# Patient Record
Sex: Female | Born: 1985 | Race: White | Hispanic: No | Marital: Married | Smoking: Former smoker
Health system: Southern US, Community
[De-identification: ages and names within clinical notes are randomized; demographics above are authoritative.]

## PROBLEM LIST (undated history)

## (undated) ENCOUNTER — Inpatient Hospital Stay (HOSPITAL_COMMUNITY): Payer: BC Managed Care – PPO

## (undated) DIAGNOSIS — J45909 Unspecified asthma, uncomplicated: Secondary | ICD-10-CM

## (undated) HISTORY — DX: Unspecified asthma, uncomplicated: J45.909

---

## 2006-03-27 ENCOUNTER — Observation Stay (HOSPITAL_COMMUNITY): Admission: EM | Admit: 2006-03-27 | Discharge: 2006-03-27 | Payer: Self-pay | Admitting: Emergency Medicine

## 2016-10-24 ENCOUNTER — Telehealth: Payer: Self-pay | Admitting: *Deleted

## 2016-10-24 NOTE — Telephone Encounter (Signed)
PreVisit Call attempted. Left VM. 

## 2016-10-25 ENCOUNTER — Encounter: Payer: Self-pay | Admitting: Physician Assistant

## 2016-10-25 ENCOUNTER — Ambulatory Visit (INDEPENDENT_AMBULATORY_CARE_PROVIDER_SITE_OTHER): Payer: Self-pay | Admitting: Physician Assistant

## 2016-10-25 ENCOUNTER — Ambulatory Visit (INDEPENDENT_AMBULATORY_CARE_PROVIDER_SITE_OTHER): Payer: BLUE CROSS/BLUE SHIELD

## 2016-10-25 VITALS — BP 136/90 | HR 90 | Temp 98.3°F | Ht 67.0 in | Wt 252.0 lb

## 2016-10-25 DIAGNOSIS — M545 Low back pain, unspecified: Secondary | ICD-10-CM | POA: Insufficient documentation

## 2016-10-25 DIAGNOSIS — J45909 Unspecified asthma, uncomplicated: Secondary | ICD-10-CM | POA: Insufficient documentation

## 2016-10-25 DIAGNOSIS — G8929 Other chronic pain: Secondary | ICD-10-CM

## 2016-10-25 DIAGNOSIS — Z23 Encounter for immunization: Secondary | ICD-10-CM | POA: Diagnosis not present

## 2016-10-25 DIAGNOSIS — J4540 Moderate persistent asthma, uncomplicated: Secondary | ICD-10-CM | POA: Diagnosis not present

## 2016-10-25 DIAGNOSIS — E6609 Other obesity due to excess calories: Secondary | ICD-10-CM | POA: Insufficient documentation

## 2016-10-25 LAB — POCT URINE PREGNANCY: Preg Test, Ur: NEGATIVE

## 2016-10-25 MED ORDER — CYCLOBENZAPRINE HCL 5 MG PO TABS: 5.0000 mg | ORAL_TABLET | Freq: Three times a day (TID) | ORAL | 0 refills | Status: DC | PRN

## 2016-10-25 MED ORDER — MELOXICAM 15 MG PO TABS: 15.0000 mg | ORAL_TABLET | Freq: Every day | ORAL | 0 refills | Status: DC

## 2016-10-25 MED ORDER — FLUTICASONE-SALMETEROL 115-21 MCG/ACT IN AERO: 2.0000 | INHALATION_SPRAY | Freq: Two times a day (BID) | RESPIRATORY_TRACT | 0 refills | Status: DC

## 2016-10-25 MED ORDER — ALBUTEROL SULFATE HFA 108 (90 BASE) MCG/ACT IN AERS: 1.0000 | INHALATION_SPRAY | RESPIRATORY_TRACT | 1 refills | Status: DC | PRN

## 2016-10-25 NOTE — Progress Notes (Signed)
Lisa Colon is a 31 y.o. female here to Establish Care and discuss low back pain she is having at present again from old injury in January.  I acted as a Neurosurgeonscribe for [employer]Samantha Jeanell Mangan, PA-C Corky Mullonna Orphanos, LPN  History of Present Illness:   Chief Complaint  Patient presents with  . Establish Care  . Back Pain    injured in Jan and now pain is back, started 3 days ago radiates down left thigh    Acute Concerns: Back pain -- in January she was went to reach for one of her cats and believes that she pulled a muscle. She had pain for about a week located at the bottom left of back, did not go to the doctor, ibuprofen was used initially. Patient has mostly been gone, however she worked in the yard over the weekend and now has some return of her back pain with radiation down the side of her L thigh. She has not been taking any medication for this. She has used heating pad 2-3 times. Denies issues with bowel or bladder, no numbness, no issues with UTI-like symptoms. She currently does not exercise however her and her husband recently bought some workout equipment at home and they're hoping to get into a workout regimen soon. She does currently weigh her highest weight ever, she does drink approximately 20 cans of beer per week. She is hoping to cut this out as well as start this exercise regimen. She denies any fevers, chills, rashes. She has a sedentary desk job. Her last period was approximately one month ago.  Chronic Issues: Asthma -- started when she was a kid, Advair twice a day keeps well controlled, when it runs out then has to use proair  Health Maintenance: Immunizations -- up to date Exercise -- no current exercise, but just bought bike and punching bag Weight -- Weight: 252 lb (114.3 kg)  -- highest weight ever  Depression screen PHQ 2/9 10/25/2016  Decreased Interest 0  Down, Depressed, Hopeless 0  PHQ - 2 Score 0   Other providers/specialists: Ob-Gyn -- last year, Dr.  Rito Ehrlich'Keefe   PMHx, SurgHx, SocialHx, Medications, and Allergies were reviewed in the Visit Navigator and updated as appropriate.  Current Medications:   Current Outpatient Prescriptions:  .  albuterol (PROVENTIL HFA;VENTOLIN HFA) 108 (90 Base) MCG/ACT inhaler, Inhale 1-2 puffs into the lungs every 4 (four) hours as needed for wheezing or shortness of breath., Disp: 1 Inhaler, Rfl: 1 .  cyclobenzaprine (FLEXERIL) 5 MG tablet, Take 1 tablet (5 mg total) by mouth 3 (three) times daily as needed for muscle spasms., Disp: 20 tablet, Rfl: 0 .  fluticasone-salmeterol (ADVAIR HFA) 115-21 MCG/ACT inhaler, Inhale 2 puffs into the lungs 2 (two) times daily., Disp: 3 Inhaler, Rfl: 0 .  meloxicam (MOBIC) 15 MG tablet, Take 1 tablet (15 mg total) by mouth daily., Disp: 30 tablet, Rfl: 0   Review of Systems:   Review of Systems  Constitutional: Positive for chills, fever and malaise/fatigue.  Genitourinary: Negative for dysuria, flank pain, frequency and urgency.  Musculoskeletal: Positive for back pain.       Low back pain, radiating down left thigh  Psychiatric/Behavioral: The patient is nervous/anxious.     Vitals:   Vitals:   10/25/16 0937  BP: 136/90  Pulse: 90  Temp: 98.3 F (36.8 C)  TempSrc: Oral  SpO2: 96%  Weight: 252 lb (114.3 kg)  Height: 5\' 7"  (1.702 m)     Body mass index is 39.47  kg/m.  Physical Exam:   Physical Exam  Constitutional: She appears well-developed. She is cooperative.  Non-toxic appearance. She does not have a sickly appearance. She does not appear ill. No distress.  Cardiovascular: Normal rate, regular rhythm, S1 normal, S2 normal, normal heart sounds and normal pulses.   No LE edema  Pulmonary/Chest: Effort normal and breath sounds normal.  Musculoskeletal:       Lumbar back: She exhibits tenderness and spasm. She exhibits no bony tenderness, no swelling, no deformity and no laceration.  No bony tenderness however does have some paraspinal tenderness in the  lumbar region. No decreased range of motion limited to pain when doing lateral side bends but does have with rotation. Positive straight leg raise with left leg and negative on the right.  Neurological: She is alert. No cranial nerve deficit or sensory deficit. GCS eye subscore is 4. GCS verbal subscore is 5. GCS motor subscore is 6.  Skin: Skin is warm.  Nursing note and vitals reviewed.   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 CT scanning of the lower lumbar spine would be useful to confer this finding.  Mild disc space narrowing at L1-2 with anterior inferior endplate spurring at L1.    Assessment and Plan:    Problem List Items Addressed This Visit      Respiratory   Asthma    Currently well controlled with Advair twice a day. Does use pro-air for breakthrough issues. I have refilled both of these medications. Follow-up with any changes present.      Relevant Medications   fluticasone-salmeterol (ADVAIR HFA) 115-21 MCG/ACT inhaler   albuterol (PROVENTIL HFA;VENTOLIN HFA) 108 (90 Base) MCG/ACT inhaler     Other   Chronic left-sided low back pain - Primary    Urine pregnancy test negative. I did get an x-ray to assess her back, as this has not been performed and she has had back pain for several months. We will try some meloxicam today. I gave her these small supply of Flexeril for breakthrough muscle spasms. I advised her that weight loss would be very beneficial for her back pain. I would like for her to follow-up with me in 2-3 months to discuss this. If her back pain does not improve in about 2 weeks, I advised that she should follow-up with Dr. Gaspar Bidding in sports medicine here at [facility].  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 CT scanning of the lower lumbar spine would be useful to confer this finding. Mild disc space narrowing at L1-2 with anterior inferior endplate spurring at L1.       Relevant Medications   meloxicam (MOBIC) 15 MG tablet   cyclobenzaprine (FLEXERIL) 5 MG tablet   Other Relevant Orders   POCT urine pregnancy (Completed)   DG Lumbar Spine 2-3 Views (Completed)   Class 2 obesity due to excess calories in adult    We discussed needing to begin an exercise program. She also needs to reduce alcohol intake. I advised her to follow-up with me in 2-3 months we can talk about this as well as do a physical.       Other Visit Diagnoses    Need for  prophylactic vaccination with combined diphtheria-tetanus-pertussis (DTP) vaccine       Relevant Orders   Tdap vaccine greater than or equal to 7yo IM (Completed)        . Reviewed expectations re: course of current medical issues. . Discussed self-management of symptoms. . Outlined signs and symptoms indicating need for more acute intervention. . Patient verbalized understanding and all questions were answered. . See orders for this visit as documented in the electronic medical record. . Patient received an After-Visit Summary.  CMA or LPN served as scribe during this visit. History, Physical, and Plan performed by medical provider. Documentation and orders reviewed and attested to.  [name], PA-C

## 2016-10-25 NOTE — Assessment & Plan Note (Addendum)
Urine pregnancy test negative. I did get an x-ray to assess her back, as this has not been performed and she has had back pain for several months. We will try some meloxicam today. I gave her these small supply of Flexeril for breakthrough muscle spasms. I advised her that weight loss would be very beneficial for her back pain. I would like for her to follow-up with me in 2-3 months to discuss this. If her back pain does not improve in about 2 weeks, I advised that she should follow-up with Dr. Gaspar BiddingMichael Rigby in sports medicine here at [facility].  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 CT scanning of the lower lumbar spine would be useful to confer this finding. Mild disc space narrowing at L1-2 with anterior inferior endplate spurring at L1.

## 2016-10-25 NOTE — Patient Instructions (Signed)
Take the Meloxicam daily, take with food to prevent stomach upset.  May take flexeril (the muscle relaxer) to help with any additional muscle spasm -- this will likely make you sleepy, I recommend that you take the first dose at home.  If you do not have relief of your pain, I would like for you to make an appointment with Dr. Gaspar BiddingMichael Rigby.  Continue to work on weight loss. Let's follow-up in 2-3 months for a physical to talk about this. Also, please work on decreasing your alcohol intake! This will help with weight loss.

## 2016-10-25 NOTE — Assessment & Plan Note (Signed)
Currently well controlled with Advair twice a day. Does use pro-air for breakthrough issues. I have refilled both of these medications. Follow-up with any changes present.

## 2016-10-25 NOTE — Assessment & Plan Note (Signed)
We discussed needing to begin an exercise program. She also needs to reduce alcohol intake. I advised her to follow-up with me in 2-3 months we can talk about this as well as do a physical.

## 2016-12-06 ENCOUNTER — Other Ambulatory Visit: Payer: Self-pay | Admitting: *Deleted

## 2016-12-06 MED ORDER — MELOXICAM 15 MG PO TABS: 15.0000 mg | ORAL_TABLET | Freq: Every day | ORAL | 0 refills | Status: DC

## 2016-12-26 ENCOUNTER — Ambulatory Visit: Payer: Self-pay | Admitting: Physician Assistant

## 2017-01-17 ENCOUNTER — Ambulatory Visit: Payer: Self-pay | Admitting: Physician Assistant

## 2017-01-23 ENCOUNTER — Other Ambulatory Visit: Payer: Self-pay | Admitting: *Deleted

## 2017-01-23 MED ORDER — FLUTICASONE-SALMETEROL 115-21 MCG/ACT IN AERO: 2.0000 | INHALATION_SPRAY | Freq: Two times a day (BID) | RESPIRATORY_TRACT | 0 refills | Status: DC

## 2017-02-07 ENCOUNTER — Ambulatory Visit: Payer: Self-pay | Admitting: Physician Assistant

## 2017-03-15 ENCOUNTER — Ambulatory Visit: Payer: BLUE CROSS/BLUE SHIELD | Admitting: Physician Assistant

## 2017-08-25 ENCOUNTER — Telehealth: Payer: Self-pay | Admitting: *Deleted

## 2017-08-25 NOTE — Telephone Encounter (Signed)
Left message on voicemail to call office.  

## 2018-03-16 ENCOUNTER — Encounter: Payer: Self-pay | Admitting: Physician Assistant

## 2018-03-16 ENCOUNTER — Ambulatory Visit: Payer: BLUE CROSS/BLUE SHIELD | Admitting: Physician Assistant

## 2018-03-16 VITALS — BP 128/84 | HR 86 | Temp 98.2°F | Ht 67.0 in | Wt 241.4 lb

## 2018-03-16 DIAGNOSIS — M5441 Lumbago with sciatica, right side: Secondary | ICD-10-CM

## 2018-03-16 MED ORDER — CYCLOBENZAPRINE HCL 5 MG PO TABS: 5.0000 mg | ORAL_TABLET | Freq: Three times a day (TID) | ORAL | 0 refills | Status: DC | PRN

## 2018-03-16 MED ORDER — FLUTICASONE-SALMETEROL 115-21 MCG/ACT IN AERO: 2.0000 | INHALATION_SPRAY | Freq: Two times a day (BID) | RESPIRATORY_TRACT | 1 refills | Status: DC

## 2018-03-16 MED ORDER — MELOXICAM 15 MG PO TABS
15.0000 mg | ORAL_TABLET | Freq: Every day | ORAL | 0 refills | Status: DC
Start: 2018-03-16 — End: 2019-02-11

## 2018-03-16 MED ORDER — METHYLPREDNISOLONE 4 MG PO TBPK: ORAL_TABLET | ORAL | 0 refills | Status: DC

## 2018-03-16 NOTE — Patient Instructions (Signed)
It was great to see you!  1. Start the oral prednisone today. It's a daily taper, start with 6 tablets daily, then 5 tablets the next day, etc until you get down to 1 daily. 2. Make an appointment here with Dr. Berline Chough 3. May take Flexeril 4. May take Mobic AFTER completing steroids if needed.  Take care,  [name] PA-C

## 2018-03-16 NOTE — Progress Notes (Signed)
Lisa Colon is a 32 y.o. female here for a follow up of a pre-existing problem.  I acted as a Neurosurgeon for [employer], PA-C Corky Mull, LPN  History of Present Illness:   Chief Complaint  Patient presents with  . Back Pain    Back Pain  This is a recurrent problem. Episode onset: Pt c/o right sided back and hip radiating down right leg, started months ago has gotten worse the past 3 months. The problem occurs constantly. The problem has been gradually worsening since onset. The pain is present in the sacro-iliac. The quality of the pain is described as stabbing and aching. The pain radiates to the right thigh. The pain is at a severity of 7/10. The pain is moderate. The pain is worse during the night (especially after working). The symptoms are aggravated by position. Stiffness is present in the morning. Associated symptoms include leg pain (right), numbness and tingling. Pertinent negatives include no abdominal pain, bladder incontinence, bowel incontinence, chest pain, fever, headaches, pelvic pain or weakness. She has tried heat and NSAIDs (Tiger balm patch) for the symptoms. The treatment provided mild relief.   Patient's last menstrual period was 03/12/2018.   Past Medical History:  Diagnosis Date  . Asthma      Social History   Socioeconomic History  . Marital status: Married    Spouse name: Not on file  . Number of children: Not on file  . Years of education: Not on file  . Highest education level: Not on file  Occupational History  . Not on file  Social Needs  . Financial resource strain: Not on file  . Food insecurity:    Worry: Not on file    Inability: Not on file  . Transportation needs:    Medical: Not on file    Non-medical: Not on file  Tobacco Use  . Smoking status: Former Smoker    Packs/day: 0.50    Years: 9.00    Pack years: 4.50    Types: Cigarettes    Last attempt to quit: 06/13/2010    Years since quitting: 7.7  . Smokeless tobacco: Never  Used  Substance and Sexual Activity  . Alcohol use: Yes    Alcohol/week: 20.0 standard drinks    Types: 20 Cans of beer per week  . Drug use: No  . Sexual activity: Yes    Birth control/protection: None  Lifestyle  . Physical activity:    Days per week: Not on file    Minutes per session: Not on file  . Stress: Not on file  Relationships  . Social connections:    Talks on phone: Not on file    Gets together: Not on file    Attends religious service: Not on file    Active member of club or organization: Not on file    Attends meetings of clubs or organizations: Not on file    Relationship status: Not on file  . Intimate partner violence:    Fear of current or ex partner: Not on file    Emotionally abused: Not on file    Physically abused: Not on file    Forced sexual activity: Not on file  Other Topics Concern  . Not on file  Social History Narrative   Works at [employer]   Married for 10 years, no children   3 cats   Husband is deaf    History reviewed. No pertinent surgical history.  Family History  Problem Relation Age  of Onset  . ALS Mother   . COPD Mother   . Hypertension Mother   . Bipolar disorder Sister   . Depression Sister   . Breast cancer Maternal Grandmother   . Kidney disease Paternal Grandmother   . Heart disease Paternal Grandfather   . Diabetes Paternal Grandfather   . Colon cancer Neg Hx     No Known Allergies  Current Medications:   Current Outpatient Medications:  .  albuterol (PROVENTIL HFA;VENTOLIN HFA) 108 (90 Base) MCG/ACT inhaler, Inhale 1-2 puffs into the lungs every 4 (four) hours as needed for wheezing or shortness of breath., Disp: 1 Inhaler, Rfl: 1 .  fluticasone-salmeterol (ADVAIR HFA) 115-21 MCG/ACT inhaler, Inhale 2 puffs into the lungs 2 (two) times daily., Disp: 3 Inhaler, Rfl: 1 .  cyclobenzaprine (FLEXERIL) 5 MG tablet, Take 1 tablet (5 mg total) by mouth 3 (three) times daily as needed for muscle spasms., Disp: 20 tablet,  Rfl: 0 .  meloxicam (MOBIC) 15 MG tablet, Take 1 tablet (15 mg total) by mouth daily., Disp: 30 tablet, Rfl: 0 .  methylPREDNISolone (MEDROL DOSEPAK) 4 MG TBPK tablet, 6-5-4-3-2-1 off, Disp: 21 tablet, Rfl: 0   Review of Systems:   Review of Systems  Constitutional: Negative for fever.  Cardiovascular: Negative for chest pain.  Gastrointestinal: Negative for abdominal pain and bowel incontinence.  Genitourinary: Negative for bladder incontinence and pelvic pain.  Musculoskeletal: Positive for back pain.  Neurological: Positive for tingling and numbness. Negative for weakness and headaches.    Vitals:   Vitals:   03/16/18 1029  BP: 128/84  Pulse: 86  Temp: 98.2 F (36.8 C)  TempSrc: Oral  SpO2: 96%  Weight: 241 lb 6.1 oz (109.5 kg)  Height: 5\' 7"  (1.702 m)     Body mass index is 37.81 kg/m.  Physical Exam:   Physical Exam  Constitutional: She appears well-developed. She is cooperative.  Non-toxic appearance. She does not have a sickly appearance. She does not appear ill. No distress.  Cardiovascular: Normal rate, regular rhythm, S1 normal, S2 normal, normal heart sounds and normal pulses.  No LE edema  Pulmonary/Chest: Effort normal and breath sounds normal.  Musculoskeletal:  No decreased ROM 2/2 pain with extension, lateral side bends, or rotation. Endorses pain with flexion. Reproducible tenderness with deep palpation to R sacroiliac area. No bony tenderness. No evidence of erythema, rash or ecchymosis.    Neurological: She is alert. GCS eye subscore is 4. GCS verbal subscore is 5. GCS motor subscore is 6.  Skin: Skin is warm, dry and intact.  Psychiatric: She has a normal mood and affect. Her speech is normal and behavior is normal.  Nursing note and vitals reviewed.   Assessment and Plan:    Caden was seen today for back pain.  Diagnoses and all orders for this visit:  Acute right-sided low back pain with right-sided sciatica No red flags on exam. Start  oral prednisone today. May take flexeril prn. Follow-up with Dr. Berline Chough next week for further evaluation. May take Mobic after prednisone is finished if needed. Patient verbalized understanding to plan.  Other orders -     methylPREDNISolone (MEDROL DOSEPAK) 4 MG TBPK tablet; 6-5-4-3-2-1 off -     fluticasone-salmeterol (ADVAIR HFA) 115-21 MCG/ACT inhaler; Inhale 2 puffs into the lungs 2 (two) times daily. -     meloxicam (MOBIC) 15 MG tablet; Take 1 tablet (15 mg total) by mouth daily. -     cyclobenzaprine (FLEXERIL) 5 MG tablet; Take 1  tablet (5 mg total) by mouth 3 (three) times daily as needed for muscle spasms.  Reviewed expectations re: course of current medical issues. Discussed self-management of symptoms. Outlined signs and symptoms indicating need for more acute intervention. Patient verbalized understanding and all questions were answered. See orders for this visit as documented in the electronic medical record. Patient received an After-Visit Summary.  CMA or LPN served as scribe during this visit. History, Physical, and Plan performed by medical provider. The above documentation has been reviewed and is accurate and complete.  [name], PA-C

## 2018-03-20 ENCOUNTER — Encounter: Payer: Self-pay | Admitting: Sports Medicine

## 2018-03-20 ENCOUNTER — Ambulatory Visit: Payer: BLUE CROSS/BLUE SHIELD | Admitting: Sports Medicine

## 2018-03-20 VITALS — BP 130/96 | HR 94 | Ht 67.0 in | Wt 244.0 lb

## 2018-03-20 DIAGNOSIS — M9908 Segmental and somatic dysfunction of rib cage: Secondary | ICD-10-CM

## 2018-03-20 DIAGNOSIS — M9905 Segmental and somatic dysfunction of pelvic region: Secondary | ICD-10-CM | POA: Diagnosis not present

## 2018-03-20 DIAGNOSIS — G8929 Other chronic pain: Secondary | ICD-10-CM

## 2018-03-20 DIAGNOSIS — M9903 Segmental and somatic dysfunction of lumbar region: Secondary | ICD-10-CM

## 2018-03-20 DIAGNOSIS — M545 Low back pain, unspecified: Secondary | ICD-10-CM

## 2018-03-20 DIAGNOSIS — M5441 Lumbago with sciatica, right side: Secondary | ICD-10-CM | POA: Diagnosis not present

## 2018-03-20 NOTE — Patient Instructions (Addendum)
Also check out [employer]" which is a program developed by Dr. Myles Lipps.   There are links to a couple of his YouTube Videos below and I would like to see performing one of his videos 5-6 days per week.    A good intro video is: "Independence from Pain 7-minute Video" - [email or web address]   Exercises that focus more on the neck are as below: Dr. Derrill Kay with [facility] teaching neck and shoulder details Part 1 - [email or web address] Part 2 Dr. Derrill Kay with [facility] quick routine to practice daily - [email or web address]  Do not try to attempt the entire video when first beginning.    Try breaking of each exercise that he goes into shorter segments.  Otherwise if they perform an exercise for 45 seconds, start with 15 seconds and rest and then resume when they begin the new activity.  If you work your way up to being able to do these videos without having to stop, I expect you will see significant improvements in your pain.  If you enjoy his videos and would like to find out more you can look on his website: [email or web address].  He has a workout streaming option as well as a DVD set available for purchase.  Amazon has the best price for his DVDs.     Please perform the exercise program that we have prepared for you and gone over in detail on a daily basis.  In addition to the handout you were provided you can access your program through: [email or web address]   Your unique program code is:  [email or web address]

## 2018-03-20 NOTE — Progress Notes (Signed)
PROCEDURE NOTE : OSTEOPATHIC MANIPULATION The decision today to treat with Osteopathic Manipulative Therapy (OMT) was based on physical exam findings. Verbal consent was obtained following a discussion with the patient regarding the of risks, benefits and potential side effects, including an acute pain flare,post manipulation soreness and need for repeat treatments.     Contraindications to OMT: NONE  Manipulation was performed as below: Regions Treated OMT Techniques Used  Lumbar spine Pelvis Sacrum HVLA muscle energy myofascial release   The patient tolerated the treatment well and reported Improved symptoms following treatment today. Patient was given medications, exercises, stretches and lifestyle modifications per AVS and verbally.   OSTEOPATHIC/STRUCTURAL EXAM:   LUMBAR SPINE: L4 FRS right (Flexed, Rotated & Sidebent) PELVIS: Right psoas spasm Right anterior innonimate SACRUM: L on L sacral torsion

## 2018-03-20 NOTE — Progress Notes (Signed)
PROCEDURE NOTE: THERAPEUTIC EXERCISES (97110) 15 minutes spent for Therapeutic exercises as below and as referenced in the AVS.  This included exercises focusing on stretching, strengthening, with significant focus on eccentric aspects.   Proper technique shown and discussed handout in great detail with ATC.  All questions were discussed and answered.   Long term goals include an improvement in range of motion, strength, endurance as well as avoiding reinjury. Frequency of visits is one time as determined during today's  office visit. Frequency of exercises to be performed is as per handout.  EXERCISES REVIEWED:  Goodman Exercises  Thoracic Mobility  Pelvic recruitment 

## 2018-03-20 NOTE — Progress Notes (Signed)
Lisa Colon. Lisa Colon Sports Medicine [facility] at [facility] [phone]  Lisa Colon - 32 y.o. female MRN [id number]  Date of birth: 1986-04-02  Visit Date: 03/20/2018  PCP: [name], PA   Referred by: [name], PA   Scribe(s) for today's visit: Christoper Fabian, LAT, ATC  SUBJECTIVE:  Lisa Colon is here for New Patient (Initial Visit) (R-sided LBP)  Referred by: Lisa Colon  HPI: Her R-sided LBP symptoms INITIALLY: Began several months ago but has worsened over the past month  Described as constant, stabbing, aching 7/10 pain , radiating to R LE (thigh) intermittently Worsened with standing and transitions from sitting to standing and vice versa; sleeping on R side Improved with heat and stretching Additional associated symptoms include: increased pain w/ coughing and sneezing; no N/T in B LEs    At this time symptoms are not changing to slightly worse compared to onset.   She has tried heat and NSAIDs.  Was prescribed prednisone dose pack by Sam and that is helping.  She still has a few days left of her steroid dose pack.  L-spine XR - 10/25/16  REVIEW OF SYSTEMS: Reports night time disturbances. Denies fevers, chills, or night sweats. Denies unexplained weight loss. Denies personal history of cancer. Denies changes in bowel or bladder habits. Denies recent unreported falls. Reports new or worsening dyspnea or wheezing.  Pt has asthma. Denies headaches or dizziness.  Denies numbness, tingling or weakness  In the extremities.  Denies dizziness or presyncopal episodes Denies lower extremity edema    HISTORY:  Prior history reviewed and updated per electronic medical record.  Social History   Occupational History  . Not on file  Tobacco Use  . Smoking status: Former Smoker    Packs/day: 0.50    Years: 9.00    Pack years: 4.50    Types: Cigarettes    Last attempt to quit: 06/13/2010    Years since quitting: 7.9  . Smokeless  tobacco: Never Used  Substance and Sexual Activity  . Alcohol use: Yes    Alcohol/week: 20.0 standard drinks    Types: 20 Cans of beer per week  . Drug use: No  . Sexual activity: Yes    Birth control/protection: None   Social History   Social History Narrative   Works at [employer]   Married for 10 years, no children   3 cats   Husband is deaf    DATA OBTAINED & REVIEWED:  No results for input(s): HGBA1C, LABURIC, CREATINE in the last 8760 hours. 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 CT scanning of the lower lumbar spine would be useful to confer this finding.  Mild disc space narrowing at L1-2 with anterior inferior endplate spurring at L1.  OBJECTIVE:  VS:  HT:5\' 7"  (170.2 cm)   WT:244 lb (110.7 kg)  BMI:38.21    BP:(!) 130/96  HR:94bpm  TEMP: ( )  RESP:97 %   PHYSICAL EXAM: CONSTITUTIONAL: Well-developed, Well-nourished and In no acute distress PSYCHIATRIC: Alert & appropriately interactive. and Not depressed or anxious appearing. RESPIRATORY: No increased work of breathing and Trachea Midline EYES: Pupils are equal., EOM intact without nystagmus. and No scleral icterus.  VASCULAR EXAM: Warm and well perfused NEURO: unremarkable  MSK Exam: Back is overall well aligned without significant deformity.  Negative straight leg raise.  No significant weakness in bilateral lower extremity myotomes.  Sensation  is intact light touch.  She has had tight hip flexors right greater than left.  ASSESSMENT   1. Acute right-sided low back pain with right-sided sciatica   2. Somatic dysfunction of lumbar region   3. Somatic dysfunction of rib cage region   4. Somatic dysfunction of pelvis region   5. Chronic left-sided low back pain, unspecified whether sciatica present     PLAN:  Pertinent additional documentation may be included in corresponding procedure notes, imaging studies, problem  based documentation and patient instructions.  Procedures:  . Osteopathic manipulation was performed today based on physical exam findings.  Please see procedure note for further information including Osteopathic Exam findings . Home Therapeutic exercises prescribed per procedure note.  Medications:  No orders of the defined types were placed in this encounter.  Discussion/Instructions: No problem-specific Assessment & Plan notes found for this encounter.  . THERAPEUTIC EXERCISE: Discussed the foundation of treatment for this condition is physical therapy and/or daily (5-6 days/week) therapeutic exercises, focusing on core strengthening, coordination, neuromuscular control/reeducation.  Home Therapeutic exercises prescribed today per procedure note. . Links to [employer] provided today per Patient Instructions.  These exercises were developed by Lisa Colon, DC with a strong emphasis on core neuromuscular reducation and postural realignment through body-weight exercises. . Discussed the underlying features of tight hip flexors leading to crouched, fetal like position that results in spinal column compression.  Including lumbar hyperflexion with hypermobility, thoracic flexion with restrictive rotation and cervical lordosis reversal . Discussed red flag symptoms that warrant earlier emergent evaluation and patient voices understanding. . Activity modifications and the importance of avoiding exacerbating activities (limiting pain to no more than a 4 / 10 during or following activity) recommended and discussed. . Functional low back pain.  Should do well with conservative measures.  Follow-up:  . Return in about 3 weeks (around 04/10/2018).   . If any lack of improvement consider: consider further diagnostic evaluation with MRI of the lumbar spine.  . At follow up will plan to consider: to consider repeat osteopathic manipulation     CMA/ATC served as scribe during this  visit. History, Physical, and Plan performed by medical provider. Documentation and orders reviewed and attested to.      [name], DO    [facility]

## 2018-04-10 ENCOUNTER — Ambulatory Visit: Payer: BLUE CROSS/BLUE SHIELD | Admitting: Sports Medicine

## 2018-05-15 ENCOUNTER — Ambulatory Visit: Payer: BLUE CROSS/BLUE SHIELD | Admitting: Sports Medicine

## 2018-05-15 ENCOUNTER — Encounter: Payer: Self-pay | Admitting: Sports Medicine

## 2018-05-15 VITALS — BP 120/92 | HR 105 | Ht 67.0 in | Wt 240.8 lb

## 2018-05-15 DIAGNOSIS — M9903 Segmental and somatic dysfunction of lumbar region: Secondary | ICD-10-CM | POA: Diagnosis not present

## 2018-05-15 DIAGNOSIS — M9908 Segmental and somatic dysfunction of rib cage: Secondary | ICD-10-CM

## 2018-05-15 DIAGNOSIS — M9904 Segmental and somatic dysfunction of sacral region: Secondary | ICD-10-CM

## 2018-05-15 DIAGNOSIS — M5441 Lumbago with sciatica, right side: Secondary | ICD-10-CM | POA: Diagnosis not present

## 2018-05-15 DIAGNOSIS — M9905 Segmental and somatic dysfunction of pelvic region: Secondary | ICD-10-CM

## 2018-05-15 DIAGNOSIS — M9902 Segmental and somatic dysfunction of thoracic region: Secondary | ICD-10-CM

## 2018-05-15 NOTE — Progress Notes (Signed)
Lisa Colon. Lisa Colon Sports Medicine [facility] at [facility] [phone]  Lisa Colon - 32 y.o. female MRN [id number]  Date of birth: 02-Jun-1986  Visit Date:   PCP: [name], PA   Referred by: [name], [place]   SUBJECTIVE:  Lisa Colon is here for Follow-up (R-sided LBP)   HPI: Patient is here for a 6-week follow-up for low back pain.  She reports she has been performing home exercises on a regular basis and has had good improvement in her symptoms but continues to have occasional sharp pain with some movements especially going from standing and walking.  Occasionally she has radiation into the right lower extremity.  She denies any burning swelling numbness or weakness.  She has been using heat as well as the therapeutic exercises with moderate provement.  Ibuprofen meloxicam have been taking her midline with only minimal improvement.  She has not tried the foundations videos due to time constraints but has been doing her exercise program.  Only minimal side effects the osteopathic ambulation performed last visit with generalized soreness but this is.  REVIEW OF SYSTEMS: Reports night time disturbances. Denies fevers, chills, or night sweats. Denies unexplained weight loss. Denies personal history of cancer. Denies changes in bowel or bladder habits. Denies recent unreported falls. Denies new or worsening dyspnea or wheezing. Denies headaches or dizziness.  Denies numbness, tingling or weakness  In the extremities.  Denies dizziness or presyncopal episodes Denies lower extremity edema    HISTORY:  Prior history reviewed and updated per electronic medical record.  Social History   Occupational History  . Not on file  Tobacco Use  . Smoking status: Former Smoker    Packs/day: 0.50    Years: 9.00    Pack years: 4.50    Types: Cigarettes    Last attempt to quit: 06/13/2010    Years since quitting: 7.9  . Smokeless tobacco: Never Used   Substance and Sexual Activity  . Alcohol use: Yes    Alcohol/week: 20.0 standard drinks    Types: 20 Cans of beer per week  . Drug use: No  . Sexual activity: Yes    Birth control/protection: None   Social History   Social History Narrative   Works at [employer]   Married for 10 years, no children   3 cats   Husband is deaf      DATA OBTAINED & REVIEWED:  No results for input(s): HGBA1C, LABURIC, CREATINE, CALCIUM, AST, ALT, TSH in the last 8760 hours.  Invalid input(s): MAGNESIUM, CK No problems updated. No specialty comments available.  OBJECTIVE:  VS:  HT:5\' 7"  (170.2 cm)   WT:240 lb 12.8 oz (109.2 kg)  BMI:37.71    BP:(!) 120/92   HR: (!) 105 bpm  TEMP: ( )  RESP:97 %   PHYSICAL EXAM: CONSTITUTIONAL: Well-developed, Well-nourished and In no acute distress PSYCHIATRIC : Alert & appropriately interactive. and Not depressed or anxious appearing. RESPIRATORY : No increased work of breathing and Trachea Midline EYES : Pupils are equal., EOM intact without nystagmus. and No scleral icterus.  VASCULAR EXAM : Warm and well perfused NEURO: unremarkable  MSK Exam:  lumbar spine  Well aligned, no significant deformity. No overlying skin changes. No focal bony tenderness TTP over Right paraspinal musculature.  Overall improved range of motion of her hips straight leg raise is negative today.  Lower extremity strength is well-preserved.  Anatomic Short left leg by ~1/2 inch     ASSESSMENT  1. Acute right-sided low back pain with right-sided sciatica   2. Somatic dysfunction of thoracic region   3. Somatic dysfunction of rib cage region   4. Somatic dysfunction of lumbar region   5. Somatic dysfunction of pelvis region   6. Somatic dysfunction of sacral region     PLAN:  Pertinent additional documentation may be included in corresponding procedure notes, imaging studies, problem based documentation and patient instructions.  Procedures:  . Osteopathic  manipulation was performed today based on physical exam findings.  Please see procedure note for further information including Osteopathic Exam findings  Medications:  No orders of the defined types were placed in this encounter.  Discussion/Instructions: No problem-specific Assessment & Plan notes found for this encounter.  3/16" heel lift added to the left shoe Discussed red flag symptoms that warrant earlier emergent evaluation and patient voices understanding. Activity modifications and the importance of avoiding exacerbating activities (limiting pain to no more than a 4 / 10 during or following activity) recommended and discussed.  Follow-up:  . Return in about 4 weeks (around 06/12/2018) for consideration of repeat Osteopathic Manipulation.  . If any lack of improvement: consider further diagnostic evaluation with MRI Lumbar Spine . At follow up will plan : to consider repeat osteopathic manipulation         Andrena MewsMichael D Paydon Carll, DO    [facility]

## 2018-05-15 NOTE — Progress Notes (Signed)
PROCEDURE NOTE : OSTEOPATHIC MANIPULATION The decision today to treat with Osteopathic Manipulative Therapy (OMT) was based on physical exam findings. Verbal consent was obtained following a discussion with the patient regarding the of risks, benefits and potential side effects, including an acute pain flare,post manipulation soreness and need for repeat treatments.     Contraindications to OMT: NONE  Manipulation was performed as below: Regions Treated OMT Techniques Used  1. Thoracic spine 2. Ribs 3. Lumbar spine 4. Pelvis 5. Sacrum . HVLA . muscle energy . myofascial release   The patient tolerated the treatment well and reported Improved symptoms following treatment today. Patient was given medications, exercises, stretches and lifestyle modifications per AVS and verbally.   OSTEOPATHIC/STRUCTURAL EXAM:   T4 -8 Neutral, Rotated LEFT, Sidebent RIGHT Rib 6 Right  Posterior L3 FRS right (Flexed, Rotated & Sidebent) Right psoas spasm Right anterior innonimate L on L sacral torsion

## 2018-05-17 ENCOUNTER — Encounter: Payer: Self-pay | Admitting: Sports Medicine

## 2018-05-26 ENCOUNTER — Encounter: Payer: Self-pay | Admitting: Sports Medicine

## 2018-06-11 ENCOUNTER — Ambulatory Visit: Payer: BLUE CROSS/BLUE SHIELD | Admitting: Sports Medicine

## 2018-06-11 ENCOUNTER — Encounter: Payer: Self-pay | Admitting: Sports Medicine

## 2018-06-11 VITALS — BP 110/86 | HR 97 | Ht 67.0 in | Wt 245.6 lb

## 2018-06-11 DIAGNOSIS — M2170 Unequal limb length (acquired), unspecified site: Secondary | ICD-10-CM

## 2018-06-11 DIAGNOSIS — M5441 Lumbago with sciatica, right side: Secondary | ICD-10-CM | POA: Diagnosis not present

## 2018-06-11 DIAGNOSIS — M9903 Segmental and somatic dysfunction of lumbar region: Secondary | ICD-10-CM

## 2018-06-11 DIAGNOSIS — M9905 Segmental and somatic dysfunction of pelvic region: Secondary | ICD-10-CM | POA: Diagnosis not present

## 2018-06-11 DIAGNOSIS — M9904 Segmental and somatic dysfunction of sacral region: Secondary | ICD-10-CM

## 2018-06-11 NOTE — Progress Notes (Signed)
Lisa FellsMichael D. Lisa Shinerigby, DO  Lisa Colon Lisa Colon [phone]  Lisa Colon - 32 y.o. female MRN [id number]  Date of birth: 1986/03/16  Visit Date:   PCP: Lisa MottoWorley, Samantha, PA   Referred by: Lisa Colon, [place]   SUBJECTIVE:  Chief Complaint  Patient presents with  . f/u LBP    Sx are improving, no longer radiating into the R leg. D/c heating pad, IBU, and Meloxicam. Doing HEP daily.     HPI: Patient is here for a 4-week follow-up for chronic right-sided low back pain.  She reports only intermittent tightness without significant day-to-day interference in her activities.  She does have some worsening symptoms prolonged standing which she has to do for her job.  She has been performing at home therapeutic exercises on a 5 to 7 day/week basis and reports responding well with this.  She is to let him take medications or use heating pads.  Occasionally using a lumbar support while working.  REVIEW OF SYSTEMS: Denies fevers, chills, recent weight gain or weight loss.  No night sweats. No significant nighttime awakenings due to this issue.    HISTORY:  Prior history reviewed and updated per electronic medical record.  Social History   Occupational History  . Not on file  Tobacco Use  . Smoking status: Former Smoker    Packs/day: 0.50    Years: 9.00    Pack years: 4.50    Types: Cigarettes    Last attempt to quit: 06/13/2010    Years since quitting: 8.0  . Smokeless tobacco: Never Used  Substance and Sexual Activity  . Alcohol use: Yes    Alcohol/week: 20.0 standard drinks    Types: 20 Cans of beer per week  . Drug use: No  . Sexual activity: Yes    Birth control/protection: None   Social History   Social History Narrative   Works at [employer]   Married for 10 years, no children   3 cats   Husband is deaf      DATA OBTAINED & REVIEWED:  No results for input(s): HGBA1C, LABURIC, CREATINE, CALCIUM, AST, ALT, TSH in the last 8760  hours.  Invalid input(s): MAGNESIUM, CK No problems updated. No specialty comments available.  OBJECTIVE:  VS:  HT:5\' 7"  (170.2 cm)   WT:245 lb 9.6 oz (111.4 kg)  BMI:38.46    BP:110/86  HR:97bpm  TEMP: ( )  RESP:97 %   PHYSICAL EXAM: Adult female.  No acute distress.  Alert and appropriate.  Negative straight leg raise bilaterally.  She is able to heel and toe walk without difficulty.  She has a true short leg on the left and has responded well to the heel lift provided previously.  She does have good internal and external rotation of bilateral hips without pain.  Lower extremity sensation and strength is intact light touch and manual muscle testing in all myotomes.-   ASSESSMENT   1. Acute right-sided low back pain with right-sided sciatica   2. Somatic dysfunction of lumbar region   3. Somatic dysfunction of pelvis region   4. Somatic dysfunction of sacral region   5. Short leg syndrome, left, acquired      PLAN:  Pertinent additional documentation may be included in corresponding procedure notes, imaging studies, problem based documentation and patient instructions.  Procedures:  Osteopathic manipulation was performed today based on physical exam findings.  Please see procedure note for further information including Osteopathic Exam findings  Medications:  No orders of the defined types were placed in this encounter.  Discussion/Instructions: No problem-specific Assessment & Plan notes found for this encounter. THERAPEUTIC EXERCISE: Discussed the foundation of treatment for this condition is physical therapy and/or daily (5-6 days/week) therapeutic exercises, focusing on core strengthening, coordination, neuromuscular control/reeducation.  Continue previously prescribed home exercise program.  Discussed appropriate use of both heat and ice with the patient today.  Discussed red flag symptoms that warrant earlier emergent evaluation and patient voices  understanding.  Overall she is doing remarkably better effectively has none of the pain that initially brought her in.  She continues to have some low lumbar soreness but this is minimal.  We discussed the importance of continuing with a functional home exercise program including looking at the subscription service for "[employer]" with Dr. Myles LippsEric Colon.  We will plan to have her follow-up only as needed and can consider repeating osteopathic manipulation today time. If any lack of improvement: consider further diagnostic evaluation with MRI lumbar spine  At follow up will plan : to consider repeat osteopathic manipulation Return if symptoms worsen or fail to improve.          Lisa MewsMichael D [name], DO    [facility]

## 2018-06-11 NOTE — Progress Notes (Signed)
PROCEDURE NOTE : OSTEOPATHIC MANIPULATION The decision today to treat with Osteopathic Manipulative Therapy (OMT) was based on physical exam findings. Verbal consent was obtained following a discussion with the patient regarding the of risks, benefits and potential side effects, including an acute pain flare,post manipulation soreness and need for repeat treatments.     Contraindications to OMT: NONE  Manipulation was performed as below: Regions Treated & Osteopathic Exam Findings  L4 FRS right (Flexed, Rotated & Sidebent) Left psoas spasm Left anterior innonimate R on R sacral torsion   OMT Techniques Used  HVLA muscle energy myofascial release    The patient tolerated the treatment well and reported Improved symptoms following treatment today. Patient was given medications, exercises, stretches and lifestyle modifications per AVS and verbally.  

## 2018-09-10 ENCOUNTER — Telehealth: Payer: Self-pay | Admitting: Physician Assistant

## 2018-09-10 NOTE — Telephone Encounter (Signed)
Please schedule a Webex visit to discuss asthma and so we can refill inhalers.  [name] PA-C

## 2018-09-10 NOTE — Telephone Encounter (Signed)
Attempted to call pt. Voice mail full. Unable to LM.

## 2018-09-11 NOTE — Telephone Encounter (Signed)
Attempted to contact pt again mailbox is full unable to leave message.

## 2018-09-25 ENCOUNTER — Telehealth: Payer: Self-pay | Admitting: *Deleted

## 2018-09-25 NOTE — Telephone Encounter (Signed)
Tried to contact pt voice mailbox full unable to leave message will try again later. Needs f/u for Asthma and refill medication.

## 2018-09-27 NOTE — Telephone Encounter (Signed)
Tried pt again no answer.

## 2019-02-05 ENCOUNTER — Encounter: Payer: Self-pay | Admitting: Physician Assistant

## 2019-02-11 ENCOUNTER — Encounter: Payer: Self-pay | Admitting: Physician Assistant

## 2019-02-11 ENCOUNTER — Ambulatory Visit (INDEPENDENT_AMBULATORY_CARE_PROVIDER_SITE_OTHER): Payer: BC Managed Care – PPO | Admitting: Physician Assistant

## 2019-02-11 ENCOUNTER — Other Ambulatory Visit: Payer: Self-pay

## 2019-02-11 VITALS — BP 126/80 | HR 71 | Temp 97.8°F | Ht 67.0 in | Wt 242.2 lb

## 2019-02-11 DIAGNOSIS — G4700 Insomnia, unspecified: Secondary | ICD-10-CM | POA: Diagnosis not present

## 2019-02-11 DIAGNOSIS — E669 Obesity, unspecified: Secondary | ICD-10-CM

## 2019-02-11 DIAGNOSIS — Z136 Encounter for screening for cardiovascular disorders: Secondary | ICD-10-CM | POA: Diagnosis not present

## 2019-02-11 DIAGNOSIS — Z13220 Encounter for screening for lipoid disorders: Secondary | ICD-10-CM

## 2019-02-11 DIAGNOSIS — Z114 Encounter for screening for human immunodeficiency virus [HIV]: Secondary | ICD-10-CM

## 2019-02-11 DIAGNOSIS — J4540 Moderate persistent asthma, uncomplicated: Secondary | ICD-10-CM | POA: Diagnosis not present

## 2019-02-11 DIAGNOSIS — Z0001 Encounter for general adult medical examination with abnormal findings: Secondary | ICD-10-CM

## 2019-02-11 LAB — CBC WITH DIFFERENTIAL/PLATELET
Basophils Absolute: 0.1 10*3/uL (ref 0.0–0.1)
Basophils Relative: 1.3 % (ref 0.0–3.0)
Eosinophils Absolute: 0.6 10*3/uL (ref 0.0–0.7)
Eosinophils Relative: 6.9 % — ABNORMAL HIGH (ref 0.0–5.0)
HCT: 39.5 % (ref 36.0–46.0)
Hemoglobin: 13.2 g/dL (ref 12.0–15.0)
Lymphocytes Relative: 29.9 % (ref 12.0–46.0)
Lymphs Abs: 2.4 10*3/uL (ref 0.7–4.0)
MCHC: 33.4 g/dL (ref 30.0–36.0)
MCV: 90.4 fl (ref 78.0–100.0)
Monocytes Absolute: 0.6 10*3/uL (ref 0.1–1.0)
Monocytes Relative: 7 % (ref 3.0–12.0)
Neutro Abs: 4.4 10*3/uL (ref 1.4–7.7)
Neutrophils Relative %: 54.9 % (ref 43.0–77.0)
Platelets: 284 10*3/uL (ref 150.0–400.0)
RBC: 4.37 Mil/uL (ref 3.87–5.11)
RDW: 13 % (ref 11.5–15.5)
WBC: 8 10*3/uL (ref 4.0–10.5)

## 2019-02-11 LAB — LIPID PANEL
Cholesterol: 182 mg/dL (ref 0–200)
HDL: 42.4 mg/dL (ref >39.00)
LDL Cholesterol: 119 mg/dL — ABNORMAL HIGH (ref 0–99)
NonHDL: 139.41
Total CHOL/HDL Ratio: 4
Triglycerides: 103 mg/dL (ref 0.0–149.0)
VLDL: 20.6 mg/dL (ref 0.0–40.0)

## 2019-02-11 LAB — COMPREHENSIVE METABOLIC PANEL
ALT: 16 U/L (ref 0–35)
AST: 15 U/L (ref 0–37)
Albumin: 4.5 g/dL (ref 3.5–5.2)
Alkaline Phosphatase: 47 U/L (ref 39–117)
BUN: 9 mg/dL (ref 6–23)
CO2: 26 mEq/L (ref 19–32)
Calcium: 9.8 mg/dL (ref 8.4–10.5)
Chloride: 105 mEq/L (ref 96–112)
Creatinine, Ser: 0.83 mg/dL (ref 0.40–1.20)
GFR: 78 mL/min (ref >60.00)
Glucose, Bld: 87 mg/dL (ref 70–99)
Potassium: 4 mEq/L (ref 3.5–5.1)
Sodium: 139 mEq/L (ref 135–145)
Total Bilirubin: 0.3 mg/dL (ref 0.2–1.2)
Total Protein: 7.6 g/dL (ref 6.0–8.3)

## 2019-02-11 LAB — HEMOGLOBIN A1C: Hgb A1c MFr Bld: 5.2 % (ref 4.6–6.5)

## 2019-02-11 MED ORDER — FLUTICASONE-SALMETEROL 115-21 MCG/ACT IN AERO: 2.0000 | INHALATION_SPRAY | Freq: Two times a day (BID) | RESPIRATORY_TRACT | 3 refills | Status: DC

## 2019-02-11 MED ORDER — TRAZODONE HCL 50 MG PO TABS: 25.0000 mg | ORAL_TABLET | Freq: Every evening | ORAL | 3 refills | Status: DC | PRN

## 2019-02-11 NOTE — Progress Notes (Signed)
I acted as a Education administrator for [employer], PA-C Anselmo Pickler, LPN  Subjective:    Lisa Colon is a 33 y.o. female and is here for a comprehensive physical exam.  HPI  Health Maintenance Due  Topic Date Due  . HIV Screening  08/08/1998    Acute Concerns: Insomnia -- Patient c/o trouble sleeping x 5-6 months. She has trouble falling asleep and staying asleep. Pt has tried numerous OTC medications with little relief. Pt is getting 4 hours of sleep on average, on a good day 6 hours. She denies any concerns with her mood.  Chronic Issues: Obesity -- her and her husband started cleaning up their diet and push exercise about two weeks ago.  Asthma -- well controlled on Advair, hasn't used albuterol in several months  Health Maintenance: Immunizations -- UTD, declines Flu Colonoscopy -- N/A Mammogram -- N/A PAP -- over due , pt will schedule with GYN Bone Density -- N/A Diet -- recently started eating healthier with husband Caffeine intake -- minimal Sleep habits -- see above Exercise -- trying to increase exercise Current Weight -- Weight: 242 lb 4 oz (109.9 kg)  Weight History: Wt Readings from Last 10 Encounters:  02/11/19 242 lb 4 oz (109.9 kg)  06/11/18 245 lb 9.6 oz (111.4 kg)  05/15/18 240 lb 12.8 oz (109.2 kg)  03/20/18 244 lb (110.7 kg)  03/16/18 241 lb 6.1 oz (109.5 kg)  10/25/16 252 lb (114.3 kg)   Mood -- good Patient's last menstrual period was 01/20/2019.   Depression screen PHQ 2/9 02/11/2019  Decreased Interest 0  Down, Depressed, Hopeless 0  PHQ - 2 Score 0   Other providers/specialists: Patient Care Team: Inda Coke, [place] as PCP - General (Physician Assistant)   PMHx, SurgHx, SocialHx, Medications, and Allergies were reviewed in the Visit Navigator and updated as appropriate.   Past Medical History:  Diagnosis Date  . Asthma     History reviewed. No pertinent surgical history.   Family History  Problem Relation Age of Onset  . ALS  Mother   . COPD Mother   . Hypertension Mother   . Bipolar disorder Sister   . Depression Sister   . Breast cancer Maternal Grandmother   . Kidney disease Paternal Grandmother   . Heart disease Paternal Grandfather   . Diabetes Paternal Grandfather   . Colon cancer Neg Hx     Social History   Tobacco Use  . Smoking status: Former Smoker    Packs/day: 0.50    Years: 9.00    Pack years: 4.50    Types: Cigarettes    Quit date: 06/13/2010    Years since quitting: 8.6  . Smokeless tobacco: Never Used  Substance Use Topics  . Alcohol use: Yes    Alcohol/week: 20.0 standard drinks    Types: 20 Cans of beer per week  . Drug use: No    Review of Systems:   Review of Systems  Constitutional: Negative for chills, fever, malaise/fatigue and weight loss.  HENT: Negative for hearing loss, sinus pain and sore throat.   Eyes: Negative for blurred vision.  Respiratory: Negative for cough and shortness of breath.   Cardiovascular: Negative for chest pain, palpitations and leg swelling.  Gastrointestinal: Negative for abdominal pain, constipation, diarrhea, heartburn, nausea and vomiting.  Genitourinary: Negative for dysuria, frequency and urgency.  Musculoskeletal: Negative for back pain, myalgias and neck pain.  Skin: Negative for itching and rash.  Neurological: Negative for dizziness, tingling, seizures, loss of  consciousness and headaches.  Endo/Heme/Allergies: Negative for polydipsia.  Psychiatric/Behavioral: Negative for depression. The patient has insomnia. The patient is not nervous/anxious.   All other systems reviewed and are negative.   Objective:   BP 126/80 (BP Location: Left Arm, Patient Position: Sitting, Cuff Size: Large)   Pulse 71   Temp 97.8 F (36.6 C) (Temporal)   Ht 5\' 7"  (1.702 m)   Wt 242 lb 4 oz (109.9 kg)   LMP 01/20/2019   SpO2 96%   BMI 37.94 kg/m   General Appearance:    Alert, cooperative, no distress, appears stated age  Head:    Normocephalic,  without obvious abnormality, atraumatic  Eyes:    PERRL, conjunctiva/corneas clear, EOM's intact, fundi    benign, both eyes  Ears:    Normal TM's and external ear canals, both ears  Nose:   Nares normal, septum midline, mucosa normal, no drainage    or sinus tenderness  Throat:   Lips, mucosa, and tongue normal; teeth and gums normal  Neck:   Supple, symmetrical, trachea midline, no adenopathy;    thyroid:  no enlargement/tenderness/nodules; no carotid   bruit or JVD  Back:     Symmetric, no curvature, ROM normal, no CVA tenderness  Lungs:     Clear to auscultation bilaterally, respirations unlabored  Chest Wall:    No tenderness or deformity   Heart:    Regular rate and rhythm, S1 and S2 normal, no murmur, rub   or gallop  Breast Exam:    Deferred  Abdomen:     Soft, non-tender, bowel sounds active all four quadrants,    no masses, no organomegaly  Genitalia:    Deferred  Rectal:    Deferred  Extremities:   Extremities normal, atraumatic, no cyanosis or edema  Pulses:   2+ and symmetric all extremities  Skin:   Skin color, texture, turgor normal, no rashes or lesions  Lymph nodes:   Cervical, supraclavicular, and axillary nodes normal  Neurologic:   CNII-XII intact, normal strength, sensation and reflexes    throughout     Assessment/Plan:   Xander was seen today for annual exam.  Diagnoses and all orders for this visit:  Encounter for general adult medical examination with abnormal findings Today patient counseled on age appropriate routine health concerns for screening and prevention, each reviewed and up to date or declined. Immunizations reviewed and up to date or declined. Labs ordered and reviewed. Risk factors for depression reviewed and negative. Hearing function and visual acuity are intact. ADLs screened and addressed as needed. Functional ability and level of safety reviewed and appropriate. Education, counseling and referrals performed based on assessed risks today.  Patient provided with a copy of personalized plan for preventive services.  Moderate persistent asthma without complication Well controlled. Continue Advair regimen. -     CBC with Differential/Platelet -     Comprehensive metabolic panel -     fluticasone-salmeterol (ADVAIR HFA) 115-21 MCG/ACT inhaler; Inhale 2 puffs into the lungs 2 (two) times daily.  Obesity, unspecified classification, unspecified obesity type, unspecified whether serious comorbidity present She has recently made changes to diet and activity level. Commended success, encouraged continued efforts. -     Hemoglobin A1c  Encounter for lipid screening for cardiovascular disease -     Lipid panel  Screening for HIV (human immunodeficiency virus) -     HIV Antibody (routine testing w rflx)  Insomnia, unspecified type Sleep hygiene discussed. Will trial Trazodone. Follow-up if no improvement. -  traZODone (DESYREL) 50 MG tablet; Take 0.5-1 tablets (25-50 mg total) by mouth at bedtime as needed for sleep.    Well Adult Exam: Labs ordered: Yes. Patient counseling was done. See below for items discussed. Discussed the patient's BMI. The BMI is not in the acceptable range; BMI management plan is completed Follow up as needed for acute illness.  Patient Counseling:   [x]     Nutrition: Stressed importance of moderation in sodium/caffeine intake, saturated fat and cholesterol, caloric balance, sufficient intake of fresh fruits, vegetables, fiber, calcium, iron, and 1 mg of folate supplement per day (for females capable of pregnancy).   [x]      Stressed the importance of regular exercise.    [x]     Substance Abuse: Discussed cessation/primary prevention of tobacco, alcohol, or other drug use; driving or other dangerous activities under the influence; availability of treatment for abuse.    [x]      Injury prevention: Discussed safety belts, safety helmets, smoke detector, smoking near bedding or upholstery.    [x]       Sexuality: Discussed sexually transmitted diseases, partner selection, use of condoms, avoidance of unintended pregnancy  and contraceptive alternatives.    [x]     Dental health: Discussed importance of regular tooth brushing, flossing, and dental visits.   [x]      Health maintenance and immunizations reviewed. Please refer to Health maintenance section.   CMA or LPN served as scribe during this visit. History, Physical, and Plan performed by medical provider. The above documentation has been reviewed and is accurate and complete.   Jarold MottoSamantha Beverlyn Mcginness, PA-C [facility]

## 2019-02-11 NOTE — Patient Instructions (Signed)
It was great to see you!  Please go to the lab for blood work.   Our office will call you with your results unless you have chosen to receive results via MyChart.  If your blood work is normal we will follow-up each year for physicals and as scheduled for chronic medical problems.  If anything is abnormal we will treat accordingly and get you in for a follow-up.  *Trazodone -- start 1/2 tab (25 mg) nightly and see how you do. May increase to 50 mg nightly. Follow-up with me virtually if this isn't effective for you.  *Inhalers sent in, let me know if there are any issues with this.  Take care,  [facility] Maintenance, Female Adopting a healthy lifestyle and getting preventive care are important in promoting health and wellness. Ask your health care provider about:  The right schedule for you to have regular tests and exams.  Things you can do on your own to prevent diseases and keep yourself healthy. What should I know about diet, weight, and exercise? Eat a healthy diet   Eat a diet that includes plenty of vegetables, fruits, low-fat dairy products, and lean protein.  Do not eat a lot of foods that are high in solid fats, added sugars, or sodium. Maintain a healthy weight Body mass index (BMI) is used to identify weight problems. It estimates body fat based on height and weight. Your health care provider can help determine your BMI and help you achieve or maintain a healthy weight. Get regular exercise Get regular exercise. This is one of the most important things you can do for your health. Most adults should:  Exercise for at least 150 minutes each week. The exercise should increase your heart rate and make you sweat (moderate-intensity exercise).  Do strengthening exercises at least twice a week. This is in addition to the moderate-intensity exercise.  Spend less time sitting. Even light physical activity can be beneficial. Watch cholesterol and blood lipids  Have your blood tested for lipids and cholesterol at 35 years of age, then have this test every 5 years. Have your cholesterol levels checked more often if:  Your lipid or cholesterol levels are high.  You are older than 35 years of age.  You are at high risk for heart disease. What should I know about cancer screening? Depending on your health history and family history, you may need to have cancer screening at various ages. This may include screening for:  Breast cancer.  Cervical cancer.  Colorectal cancer.  Skin cancer.  Lung cancer. What should I know about heart disease, diabetes, and high blood pressure? Blood pressure and heart disease  High blood pressure causes heart disease and increases the risk of stroke. This is more likely to develop in people who have high blood pressure readings, are of African descent, or are overweight.  Have your blood pressure checked: ? Every 3-5 years if you are 39-63 years of age. ? Every year if you are 91 years old or older. Diabetes Have regular diabetes screenings. This checks your fasting blood sugar level. Have the screening done:  Once every three years after age 3 if you are at a normal weight and have a low risk for diabetes.  More often and at a younger age if you are overweight or have a high risk for diabetes. What should I know about preventing infection? Hepatitis B If you have a higher risk for hepatitis B, you should be screened  for this virus. Talk with your health care provider to find out if you are at risk for hepatitis B infection. Hepatitis C Testing is recommended for:  Everyone born from [id number] through 1967.  Anyone with known risk factors for hepatitis C. Sexually transmitted infections (STIs)  Get screened for STIs, including gonorrhea and chlamydia, if: ? You are sexually active and are younger than 35 years of age. ? You are older than 35 years of age and your health care provider tells you that you  are at risk for this type of infection. ? Your sexual activity has changed since you were last screened, and you are at increased risk for chlamydia or gonorrhea. Ask your health care provider if you are at risk.  Ask your health care provider about whether you are at high risk for HIV. Your health care provider may recommend a prescription medicine to help prevent HIV infection. If you choose to take medicine to prevent HIV, you should first get tested for HIV. You should then be tested every 3 months for as long as you are taking the medicine. Pregnancy  If you are about to stop having your period (premenopausal) and you may become pregnant, seek counseling before you get pregnant.  Take 400 to 800 micrograms (mcg) of folic acid every day if you become pregnant.  Ask for birth control (contraception) if you want to prevent pregnancy. Osteoporosis and menopause Osteoporosis is a disease in which the bones lose minerals and strength with aging. This can result in bone fractures. If you are 35 years old or older, or if you are at risk for osteoporosis and fractures, ask your health care provider if you should:  Be screened for bone loss.  Take a calcium or vitamin D supplement to lower your risk of fractures.  Be given hormone replacement therapy (HRT) to treat symptoms of menopause. Follow these instructions at home: Lifestyle  Do not use any products that contain nicotine or tobacco, such as cigarettes, e-cigarettes, and chewing tobacco. If you need help quitting, ask your health care provider.  Do not use street drugs.  Do not share needles.  Ask your health care provider for help if you need support or information about quitting drugs. Alcohol use  Do not drink alcohol if: ? Your health care provider tells you not to drink. ? You are pregnant, may be pregnant, or are planning to become pregnant.  If you drink alcohol: ? Limit how much you use to 0-1 drink a day. ? Limit intake  if you are breastfeeding.  Be aware of how much alcohol is in your drink. In the [place] one drink equals one 12 oz bottle of beer (355 mL), one 5 oz glass of wine (148 mL), or one 1 oz glass of hard liquor (44 mL). General instructions  Schedule regular health, dental, and eye exams.  Stay current with your vaccines.  Tell your health care provider if: ? You often feel depressed. ? You have ever been abused or do not feel safe at home. Summary  Adopting a healthy lifestyle and getting preventive care are important in promoting health and wellness.  Follow your health care provider's instructions about healthy diet, exercising, and getting tested or screened for diseases.  Follow your health care provider's instructions on monitoring your cholesterol and blood pressure. This information is not intended to replace advice given to you by your health care provider. Make sure you discuss any questions you have with your health care  provider. Document Released: 12/13/2010 Document Revised: 05/23/2018 Document Reviewed: 05/23/2018 Elsevier Patient Education  2020 [employer].

## 2019-02-12 LAB — HIV ANTIBODY (ROUTINE TESTING W REFLEX): HIV 1&2 Ab, 4th Generation: NONREACTIVE

## 2019-04-05 ENCOUNTER — Other Ambulatory Visit: Payer: Self-pay | Admitting: Physician Assistant

## 2019-04-05 DIAGNOSIS — G4700 Insomnia, unspecified: Secondary | ICD-10-CM

## 2019-06-11 ENCOUNTER — Ambulatory Visit: Payer: BC Managed Care – PPO | Attending: Internal Medicine

## 2019-06-11 DIAGNOSIS — Z20822 Contact with and (suspected) exposure to covid-19: Secondary | ICD-10-CM

## 2019-06-13 ENCOUNTER — Encounter: Payer: Self-pay | Admitting: Physician Assistant

## 2019-06-13 ENCOUNTER — Other Ambulatory Visit: Payer: Self-pay

## 2019-06-13 ENCOUNTER — Ambulatory Visit (INDEPENDENT_AMBULATORY_CARE_PROVIDER_SITE_OTHER): Payer: BC Managed Care – PPO | Admitting: Physician Assistant

## 2019-06-13 DIAGNOSIS — J4541 Moderate persistent asthma with (acute) exacerbation: Secondary | ICD-10-CM | POA: Diagnosis not present

## 2019-06-13 LAB — NOVEL CORONAVIRUS, NAA: SARS-CoV-2, NAA: NOT DETECTED

## 2019-06-13 MED ORDER — PREDNISONE 50 MG PO TABS: ORAL_TABLET | ORAL | 0 refills | Status: DC

## 2019-06-13 MED ORDER — ALBUTEROL SULFATE HFA 108 (90 BASE) MCG/ACT IN AERS: 1.0000 | INHALATION_SPRAY | RESPIRATORY_TRACT | 3 refills | Status: DC | PRN

## 2019-06-13 MED ORDER — AZITHROMYCIN 250 MG PO TABS: ORAL_TABLET | ORAL | 0 refills | Status: DC

## 2019-06-13 NOTE — Progress Notes (Signed)
TELEPHONE ENCOUNTER   Patient verbally agreed to telephone visit and is aware that copayment and coinsurance may apply. Patient was treated using telemedicine according to accepted telemedicine protocols.  Location of the patient: home Location of provider: [place] Names of all persons participating in the telemedicine service and role in the encounter: Inda Coke, [place] , Newell Coral  Subjective:   Chief Complaint  Patient presents with  . Asthma     HPI   Patient reports sore throat and shortness of breath.  She has baseline asthma that she uses Advair for regularly.  She is used her albuterol inhaler just a few times since onset of symptoms.  She is getting short of breath with ambulation and feels like she is having an asthma flare.  She had a rapid Covid test done on Tuesday morning and the results were negative today.  She does have decreased taste but thinks that this is due to her being sick.  She denies: Diarrhea, loss of smell, fever, productive cough, chest pain, poor appetite, inability to stay hydrated.  Patient Active Problem List   Diagnosis Date Noted  . Asthma 10/25/2016  . Chronic left-sided low back pain 10/25/2016  . Class 2 obesity due to excess calories in adult 10/25/2016   Social History   Tobacco Use  . Smoking status: Former Smoker    Packs/day: 0.50    Years: 9.00    Pack years: 4.50    Types: Cigarettes    Quit date: 06/13/2010    Years since quitting: 9.0  . Smokeless tobacco: Never Used  Substance Use Topics  . Alcohol use: Yes    Alcohol/week: 20.0 standard drinks    Types: 20 Cans of beer per week    Current Outpatient Medications:  .  albuterol (VENTOLIN HFA) 108 (90 Base) MCG/ACT inhaler, Inhale 1-2 puffs into the lungs every 4 (four) hours as needed for wheezing or shortness of breath., Disp: 18 g, Rfl: 3 .  azithromycin (ZITHROMAX) 250 MG tablet, Take two tablets on day 1, then one daily x 4 days, Disp: 6 tablet,  Rfl: 0 .  fluticasone-salmeterol (ADVAIR HFA) 115-21 MCG/ACT inhaler, Inhale 2 puffs into the lungs 2 (two) times daily., Disp: 3 Inhaler, Rfl: 3 .  predniSONE (DELTASONE) 50 MG tablet, Take one tablet daily x 5 days, Disp: 5 tablet, Rfl: 0 .  traZODone (DESYREL) 50 MG tablet, TAKE 0.5-1 TABLETS (25-50 MG TOTAL) BY MOUTH AT BEDTIME AS NEEDED FOR SLEEP., Disp: 90 tablet, Rfl: 0 No Known Allergies  Assessment & Plan:   1. Moderate persistent asthma with exacerbation    Patient is in no acute distress on telephone today.  Speaking in full sentences without difficulty.  We are going to start 50 mg prednisone for 5 days, and I have given her a safety net prescription of azithromycin for her to take should her symptoms worsen or she develops fever or other signs of infection.  I did discuss with her that there is a low threshold for her to go to the ER or urgent care if she has any changes in her breathing.  Patient verbalized understanding to plan.  No orders of the defined types were placed in this encounter.  Meds ordered this encounter  Medications  . predniSONE (DELTASONE) 50 MG tablet    Sig: Take one tablet daily x 5 days    Dispense:  5 tablet    Refill:  0    Order Specific Question:  Supervising Provider    Answer:   Ardith Dark [phone]  . azithromycin (ZITHROMAX) 250 MG tablet    Sig: Take two tablets on day 1, then one daily x 4 days    Dispense:  6 tablet    Refill:  0    Order Specific Question:   Supervising Provider    Answer:   Ardith Dark [id number]  . albuterol (VENTOLIN HFA) 108 (90 Base) MCG/ACT inhaler    Sig: Inhale 1-2 puffs into the lungs every 4 (four) hours as needed for wheezing or shortness of breath.    Dispense:  18 g    Refill:  3    Order Specific Question:   Supervising Provider    Answer:   Theodore Demark    [name], PA 06/13/2019  Time spent with the patient: 8 minutes, spent in obtaining information about her symptoms,  reviewing her previous labs, evaluations, and treatments, counseling her about her condition (please see the discussed topics above), and developing a plan to further investigate it; she had a number of questions which I addressed.

## 2019-06-13 NOTE — Telephone Encounter (Signed)
Spoke to pt told her [facility] does not have any openings today but Dr. Rogers Blocker has an opening at 1:00PM for virtual visit if that is okay. PT said yes that would be fine. Appt scheduled but pt does not have access for Doxy. Told her nurse will call her prior to appt time. Pt verbalized understanding.

## 2019-07-08 ENCOUNTER — Other Ambulatory Visit: Payer: Self-pay | Admitting: Physician Assistant

## 2019-07-08 NOTE — Telephone Encounter (Signed)
Rx request 

## 2019-07-08 NOTE — Telephone Encounter (Signed)
Please call and see why patient would like medication refill?

## 2019-07-26 ENCOUNTER — Other Ambulatory Visit: Payer: Self-pay | Admitting: Physician Assistant

## 2019-07-26 ENCOUNTER — Telehealth: Payer: Self-pay | Admitting: *Deleted

## 2019-07-26 DIAGNOSIS — G4700 Insomnia, unspecified: Secondary | ICD-10-CM

## 2019-07-26 MED ORDER — TRAZODONE HCL 50 MG PO TABS: 25.0000 mg | ORAL_TABLET | Freq: Every evening | ORAL | 2 refills | Status: DC | PRN

## 2019-07-26 NOTE — Telephone Encounter (Signed)
Received fax from [facility] requesting refill for Trazodone 50 mg tablet. Last OV 06/13/2019.

## 2019-07-26 NOTE — Telephone Encounter (Signed)
Sent!

## 2019-12-25 ENCOUNTER — Encounter: Payer: Self-pay | Admitting: Physician Assistant

## 2019-12-25 NOTE — Telephone Encounter (Signed)
Patient has been scheduled with Dr. Mardelle Matte for 12/26/19

## 2019-12-25 NOTE — Telephone Encounter (Signed)
[place], please see message from [place] and schedule patient.

## 2019-12-26 ENCOUNTER — Other Ambulatory Visit: Payer: Self-pay

## 2019-12-26 ENCOUNTER — Encounter: Payer: Self-pay | Admitting: Family Medicine

## 2019-12-26 ENCOUNTER — Ambulatory Visit: Payer: BC Managed Care – PPO | Admitting: Family Medicine

## 2019-12-26 VITALS — BP 132/90 | HR 85 | Temp 97.7°F | Resp 16 | Ht 67.0 in | Wt 232.2 lb

## 2019-12-26 DIAGNOSIS — R399 Unspecified symptoms and signs involving the genitourinary system: Secondary | ICD-10-CM | POA: Diagnosis not present

## 2019-12-26 DIAGNOSIS — N3000 Acute cystitis without hematuria: Secondary | ICD-10-CM

## 2019-12-26 LAB — POCT URINALYSIS DIPSTICK
Bilirubin, UA: NEGATIVE
Blood, UA: NEGATIVE
Glucose, UA: NEGATIVE
Ketones, UA: NEGATIVE
Nitrite, UA: NEGATIVE
Protein, UA: NEGATIVE
Spec Grav, UA: >=1.03 — AB (ref 1.010–1.025)
Urobilinogen, UA: 0.2 E.U./dL
pH, UA: 6 (ref 5.0–8.0)

## 2019-12-26 MED ORDER — NITROFURANTOIN MONOHYD MACRO 100 MG PO CAPS: 100.0000 mg | ORAL_CAPSULE | Freq: Two times a day (BID) | ORAL | 0 refills | Status: DC

## 2019-12-26 NOTE — Progress Notes (Signed)
Subjective   CC:  Chief Complaint  Patient presents with  . Urinary Tract Infection    symptoms started around 5 days ago, frequency but no burning sensation, has been taking AZO x 3 days   Same day acute visit; PCP not available. New pt to me. Chart reviewed.   HPI: Lisa Colon is a 34 y.o. female who presents to the office today to address the problems listed above in the chief complaint.  Patient reports a 5-day history of urinary frequency.  She has been mild sensation of increased urinary pressure.  She denies frank dysuria but does notice some throbbing pain at the end of urination, fevers flank pain nausea vomiting or gross hematuria.  She has never had bladder infection in the past.  She does admit that she tends to hold her urine for many consecutive hours during her workday.  She denies history of interstitial cystitis.  She denies vaginal symptoms including vaginal discharge or pelvic pain.  Last dose of AZO was yesterday morning.  Her symptoms are mildly improving.  Assessment  1. Acute cystitis without hematuria   2. UTI symptoms      Plan   Possible acute cystitis: Await culture, treat with Macrobid twice daily for 3 to 5 days.  Discussed bladder health.  Follow up: As needed No orders of the defined types were placed in this encounter.  Meds ordered this encounter  Medications  . nitrofurantoin, macrocrystal-monohydrate, (MACROBID) 100 MG capsule    Sig: Take 1 capsule (100 mg total) by mouth 2 (two) times daily.    Dispense:  10 capsule    Refill:  0      I reviewed the patients updated PMH, FH, and SocHx.    Patient Active Problem List   Diagnosis Date Noted  . Asthma 10/25/2016  . Chronic left-sided low back pain 10/25/2016  . Class 2 obesity due to excess calories in adult 10/25/2016   Current Meds  Medication Sig  . albuterol (VENTOLIN HFA) 108 (90 Base) MCG/ACT inhaler Inhale 1-2 puffs into the lungs every 4 (four) hours as needed for wheezing or  shortness of breath.  . fluticasone-salmeterol (ADVAIR HFA) 115-21 MCG/ACT inhaler Inhale 2 puffs into the lungs 2 (two) times daily.  . traZODone (DESYREL) 50 MG tablet Take 0.5-1 tablets (25-50 mg total) by mouth at bedtime as needed for sleep.    Review of Systems: Cardiovascular: negative for chest pain Respiratory: negative for SOB or persistent cough Gastrointestinal: negative for abdominal pain Constitutional: Negative for fever malaise or anorexia  Objective  Vitals: BP 132/90   Pulse 85   Temp 97.7 F (36.5 C) (Temporal)   Resp 16   Ht 5\' 7"  (1.702 m)   Wt 232 lb 3.2 oz (105.3 kg)   SpO2 98%   BMI 36.37 kg/m  General: no acute distress, appears well Psych:  Alert and oriented, normal mood and affect Gastrointestinal: soft, flat abdomen, normal active bowel sounds, no palpable masses, no hepatosplenomegaly, no appreciated hernias, NO CVAT, no suprapubic ttp w/o rebound or guarding  Office Visit on 12/26/2019  Component Date Value Ref Range Status  . Color, UA 12/26/2019 yellow   Final  . Clarity, UA 12/26/2019 clear   Final  . Glucose, UA 12/26/2019 Negative  Negative Final  . Bilirubin, UA 12/26/2019 negative   Final  . Ketones, UA 12/26/2019 negative   Final  . Spec Grav, UA 12/26/2019 >=1.030* 1.010 - 1.025 Final  . Blood, UA 12/26/2019 negative  Final  . pH, UA 12/26/2019 6.0  5.0 - 8.0 Final  . Protein, UA 12/26/2019 Negative  Negative Final  . Urobilinogen, UA 12/26/2019 0.2  0.2 or 1.0 E.U./dL Final  . Nitrite, UA 59/74/1638 negative   Final  . Leukocytes, UA 12/26/2019 Large (3+)* Negative Final    Commons side effects, risks, benefits, and alternatives for medications and treatment plan prescribed today were discussed, and the patient expressed understanding of the given instructions. Patient is instructed to call or message via MyChart if he/she has any questions or concerns regarding our treatment plan. No barriers to understanding were identified. We  discussed Red Flag symptoms and signs in detail. Patient expressed understanding regarding what to do in case of urgent or emergency type symptoms.   Medication list was reconciled, printed and provided to the patient in AVS. Patient instructions and summary information was reviewed with the patient as documented in the AVS. This note was prepared with assistance of Dragon voice recognition software. Occasional wrong-word or sound-a-like substitutions may have occurred due to the inherent limitations of voice recognition software

## 2019-12-26 NOTE — Patient Instructions (Signed)
Please follow up if symptoms do not improve or as needed.    Urinary Tract Infection, Adult  A urinary tract infection (UTI) is an infection of any part of the urinary tract. The urinary tract includes the kidneys, ureters, bladder, and urethra. These organs make, store, and get rid of urine in the body. Your health care provider may use other names to describe the infection. An upper UTI affects the ureters and kidneys (pyelonephritis). A lower UTI affects the bladder (cystitis) and urethra (urethritis). What are the causes? Most urinary tract infections are caused by bacteria in your genital area, around the entrance to your urinary tract (urethra). These bacteria grow and cause inflammation of your urinary tract. What increases the risk? You are more likely to develop this condition if:  You have a urinary catheter that stays in place (indwelling).  You are not able to control when you urinate or have a bowel movement (you have incontinence).  You are female and you: ? Use a spermicide or diaphragm for birth control. ? Have low estrogen levels. ? Are pregnant.  You have certain genes that increase your risk (genetics).  You are sexually active.  You take antibiotic medicines.  You have a condition that causes your flow of urine to slow down, such as: ? An enlarged prostate, if you are female. ? Blockage in your urethra (stricture). ? A kidney stone. ? A nerve condition that affects your bladder control (neurogenic bladder). ? Not getting enough to drink, or not urinating often.  You have certain medical conditions, such as: ? Diabetes. ? A weak disease-fighting system (immunesystem). ? Sickle cell disease. ? Gout. ? Spinal cord injury. What are the signs or symptoms? Symptoms of this condition include:  Needing to urinate right away (urgently).  Frequent urination or passing small amounts of urine frequently.  Pain or burning with urination.  Blood in the  urine.  Urine that smells bad or unusual.  Trouble urinating.  Cloudy urine.  Vaginal discharge, if you are female.  Pain in the abdomen or the lower back. You may also have:  Vomiting or a decreased appetite.  Confusion.  Irritability or tiredness.  A fever.  Diarrhea. The first symptom in older adults may be confusion. In some cases, they may not have any symptoms until the infection has worsened. How is this diagnosed? This condition is diagnosed based on your medical history and a physical exam. You may also have other tests, including:  Urine tests.  Blood tests.  Tests for sexually transmitted infections (STIs). If you have had more than one UTI, a cystoscopy or imaging studies may be done to determine the cause of the infections. How is this treated? Treatment for this condition includes:  Antibiotic medicine.  Over-the-counter medicines to treat discomfort.  Drinking enough water to stay hydrated. If you have frequent infections or have other conditions such as a kidney stone, you may need to see a health care provider who specializes in the urinary tract (urologist). In rare cases, urinary tract infections can cause sepsis. Sepsis is a life-threatening condition that occurs when the body responds to an infection. Sepsis is treated in the hospital with IV antibiotics, fluids, and other medicines. Follow these instructions at home:  Medicines  Take over-the-counter and prescription medicines only as told by your health care provider.  If you were prescribed an antibiotic medicine, take it as told by your health care provider. Do not stop using the antibiotic even if you start to  feel better. General instructions  Make sure you: ? Empty your bladder often and completely. Do not hold urine for long periods of time. ? Empty your bladder after sex. ? Wipe from front to back after a bowel movement if you are female. Use each tissue one time when you  wipe.  Drink enough fluid to keep your urine pale yellow.  Keep all follow-up visits as told by your health care provider. This is important. Contact a health care provider if:  Your symptoms do not get better after 1-2 days.  Your symptoms go away and then return. Get help right away if you have:  Severe pain in your back or your lower abdomen.  A fever.  Nausea or vomiting. Summary  A urinary tract infection (UTI) is an infection of any part of the urinary tract, which includes the kidneys, ureters, bladder, and urethra.  Most urinary tract infections are caused by bacteria in your genital area, around the entrance to your urinary tract (urethra).  Treatment for this condition often includes antibiotic medicines.  If you were prescribed an antibiotic medicine, take it as told by your health care provider. Do not stop using the antibiotic even if you start to feel better.  Keep all follow-up visits as told by your health care provider. This is important. This information is not intended to replace advice given to you by your health care provider. Make sure you discuss any questions you have with your health care provider. Document Revised: 05/17/2018 Document Reviewed: 12/07/2017 Elsevier Patient Education  2020 [employer].

## 2019-12-27 ENCOUNTER — Ambulatory Visit: Payer: BC Managed Care – PPO | Admitting: Physician Assistant

## 2019-12-28 LAB — URINE CULTURE
MICRO NUMBER:: 10709702
SPECIMEN QUALITY:: ADEQUATE

## 2020-03-23 ENCOUNTER — Encounter: Payer: Self-pay | Admitting: Physician Assistant

## 2020-03-23 ENCOUNTER — Other Ambulatory Visit: Payer: BC Managed Care – PPO

## 2020-03-23 DIAGNOSIS — Z20822 Contact with and (suspected) exposure to covid-19: Secondary | ICD-10-CM

## 2020-03-24 LAB — NOVEL CORONAVIRUS, NAA: SARS-CoV-2, NAA: DETECTED — AB

## 2020-03-24 LAB — SARS-COV-2, NAA 2 DAY TAT

## 2020-03-24 MED ORDER — ALBUTEROL SULFATE HFA 108 (90 BASE) MCG/ACT IN AERS: 1.0000 | INHALATION_SPRAY | RESPIRATORY_TRACT | 3 refills | Status: DC | PRN

## 2020-03-25 ENCOUNTER — Other Ambulatory Visit: Payer: Self-pay | Admitting: Physician Assistant

## 2020-03-25 DIAGNOSIS — U071 COVID-19: Secondary | ICD-10-CM

## 2020-03-25 DIAGNOSIS — J4520 Mild intermittent asthma, uncomplicated: Secondary | ICD-10-CM

## 2020-03-25 DIAGNOSIS — Z6835 Body mass index (BMI) 35.0-35.9, adult: Secondary | ICD-10-CM

## 2020-03-25 NOTE — Progress Notes (Signed)
I connected by phone with Lisa Colon on 03/25/2020 at 2:00 PM to discuss the potential use of a new treatment for mild to moderate COVID-19 viral infection in non-hospitalized patients.  This patient is a 34 y.o. female that meets the FDA criteria for Emergency Use Authorization of COVID monoclonal antibody casirivimab/imdevimab or bamlanivimab/eteseviamb.  Has a (+) direct SARS-CoV-2 viral test result  Has mild or moderate COVID-19   Is NOT hospitalized due to COVID-19  Is within 10 days of symptom onset  Has at least one of the high risk factor(s) for progression to severe COVID-19 and/or hospitalization as defined in EUA.  Specific high risk criteria : BMI > 25 and Chronic Lung Disease   I have spoken and communicated the following to the patient or parent/caregiver regarding COVID monoclonal antibody treatment:  1. FDA has authorized the emergency use for the treatment of mild to moderate COVID-19 in adults and pediatric patients with positive results of direct SARS-CoV-2 viral testing who are 108 years of age and older weighing at least 40 kg, and who are at high risk for progressing to severe COVID-19 and/or hospitalization.  2. The significant known and potential risks and benefits of COVID monoclonal antibody, and the extent to which such potential risks and benefits are unknown.  3. Information on available alternative treatments and the risks and benefits of those alternatives, including clinical trials.  4. Patients treated with COVID monoclonal antibody should continue to self-isolate and use infection control measures (e.g., wear mask, isolate, social distance, avoid sharing personal items, clean and disinfect "high touch" surfaces, and frequent handwashing) according to CDC guidelines.   5. The patient or parent/caregiver has the option to accept or refuse COVID monoclonal antibody treatment.  After reviewing this information with the patient, the patient has agreed to  receive one of the available covid 19 monoclonal antibodies and will be provided an appropriate fact sheet prior to infusion.   [place], [place] 03/25/2020 2:00 PM

## 2020-03-26 ENCOUNTER — Ambulatory Visit (HOSPITAL_COMMUNITY)
Admission: RE | Admit: 2020-03-26 | Discharge: 2020-03-26 | Disposition: A | Discharge status: Home / Self Care | Payer: BC Managed Care – PPO | Source: Ambulatory Visit | Attending: Pulmonary Disease | Admitting: Pulmonary Disease

## 2020-03-26 DIAGNOSIS — U071 COVID-19: Secondary | ICD-10-CM | POA: Insufficient documentation

## 2020-03-26 MED ORDER — SODIUM CHLORIDE 0.9 % IV SOLN: Freq: Once | INTRAVENOUS | Status: AC

## 2020-03-26 MED ORDER — METHYLPREDNISOLONE SODIUM SUCC 125 MG IJ SOLR: 125.0000 mg | Freq: Once | INTRAMUSCULAR | Status: DC | PRN

## 2020-03-26 MED ORDER — EPINEPHRINE 0.3 MG/0.3ML IJ SOAJ: 0.3000 mg | Freq: Once | INTRAMUSCULAR | Status: DC | PRN

## 2020-03-26 MED ORDER — DIPHENHYDRAMINE HCL 50 MG/ML IJ SOLN: 50.0000 mg | Freq: Once | INTRAMUSCULAR | Status: DC | PRN

## 2020-03-26 MED ORDER — ALBUTEROL SULFATE HFA 108 (90 BASE) MCG/ACT IN AERS: 2.0000 | INHALATION_SPRAY | Freq: Once | RESPIRATORY_TRACT | Status: DC | PRN

## 2020-03-26 MED ORDER — FAMOTIDINE IN NACL 20-0.9 MG/50ML-% IV SOLN: 20.0000 mg | Freq: Once | INTRAVENOUS | Status: DC | PRN

## 2020-03-26 MED ORDER — SODIUM CHLORIDE 0.9 % IV SOLN: INTRAVENOUS | Status: DC | PRN

## 2020-03-26 NOTE — Discharge Instructions (Signed)

## 2020-03-26 NOTE — Progress Notes (Signed)
  Diagnosis: COVID-19  Physician:dr wright  Procedure: [facility] Infusion [facility] Med: bamlanivimab\etesevimab infusion - Provided patient with bamlanimivab\etesevimab fact sheet for patients, parents and caregivers prior to infusion.  Complications: No immediate complications noted.  Discharge: Discharged home   Karalina Tift S Arlen Dupuis 03/26/2020   

## 2020-04-06 ENCOUNTER — Other Ambulatory Visit: Payer: BC Managed Care – PPO

## 2020-04-06 DIAGNOSIS — Z20822 Contact with and (suspected) exposure to covid-19: Secondary | ICD-10-CM

## 2020-04-07 ENCOUNTER — Telehealth: Payer: Self-pay

## 2020-04-07 ENCOUNTER — Ambulatory Visit (HOSPITAL_COMMUNITY): Admission: RE | Admit: 2020-04-07 | Payer: BC Managed Care – PPO | Source: Ambulatory Visit

## 2020-04-07 ENCOUNTER — Other Ambulatory Visit (HOSPITAL_COMMUNITY): Payer: BC Managed Care – PPO

## 2020-04-07 ENCOUNTER — Telehealth (INDEPENDENT_AMBULATORY_CARE_PROVIDER_SITE_OTHER): Payer: BC Managed Care – PPO | Admitting: Physician Assistant

## 2020-04-07 ENCOUNTER — Ambulatory Visit (HOSPITAL_COMMUNITY): Payer: BC Managed Care – PPO

## 2020-04-07 ENCOUNTER — Encounter: Payer: Self-pay | Admitting: Physician Assistant

## 2020-04-07 DIAGNOSIS — R071 Chest pain on breathing: Secondary | ICD-10-CM

## 2020-04-07 LAB — NOVEL CORONAVIRUS, NAA: SARS-CoV-2, NAA: NOT DETECTED

## 2020-04-07 LAB — SARS-COV-2, NAA 2 DAY TAT

## 2020-04-07 MED ORDER — PREDNISONE 50 MG PO TABS: ORAL_TABLET | ORAL | 0 refills | Status: DC

## 2020-04-07 NOTE — Telephone Encounter (Signed)
Called pt and let her know she is scheduled at 4pm at [facility] to get CT-she needs to arrive at 3:45

## 2020-04-07 NOTE — Progress Notes (Signed)
Virtual Visit via Video   I connected with Lisa Colon on 04/07/20 at 12:00 PM EDT by a video enabled telemedicine application and verified that I am speaking with the correct person using two identifiers. Location patient: Home Location provider: [facility], Office Persons participating in the virtual visit: Othell, Diluzio PA-C, Corky Mull, LPN   I discussed the limitations of evaluation and management by telemedicine and the availability of in person appointments. The patient expressed understanding and agreed to proceed.  I acted as a Neurosurgeon for [employer], PA-C [employer], LPN   Subjective:   HPI:   Patient tested positive for COVID-19 on 03/23/20. Today is day 15 of her illness. She has been quarantining and overall her symptoms have remained stable, except her SOB has not improved as she would have liked. Her husband has recovered completely but she is having significant SOB with activity such as walking up stairs or around home. Has had some chest tightness.  Currently using Advair as prescribed. She has required use of her albuterol inhaler as frequently as 6 times a day.  Pulse ox has been around 95-97%, lowest that it has been is 92%. Does not feel like she has had increased HR or palpitations.  Vaccination status: yes  Smoking status: Lisa Colon  reports that she quit smoking about 9 years ago. Her smoking use included cigarettes. She has a 4.50 pack-year smoking history. She has never used smokeless tobacco. If female, currently pregnant? []   Yes [x]   No  ROS: See pertinent positives and negatives per HPI.  Patient Active Problem List   Diagnosis Date Noted   Asthma 10/25/2016   Chronic left-sided low back pain 10/25/2016   Class 2 obesity due to excess calories in adult 10/25/2016    Social History   Tobacco Use   Smoking status: Former Smoker    Packs/day: 0.50    Years: 9.00    Pack years: 4.50    Types: Cigarettes     Quit date: 06/13/2010    Years since quitting: 9.8   Smokeless tobacco: Never Used  Substance Use Topics   Alcohol use: Yes    Alcohol/week: 20.0 standard drinks    Types: 20 Cans of beer per week    Current Outpatient Medications:    albuterol (VENTOLIN HFA) 108 (90 Base) MCG/ACT inhaler, Inhale 1-2 puffs into the lungs every 4 (four) hours as needed for wheezing or shortness of breath., Disp: 18 g, Rfl: 3   fluticasone-salmeterol (ADVAIR HFA) 115-21 MCG/ACT inhaler, Inhale 2 puffs into the lungs 2 (two) times daily., Disp: 3 Inhaler, Rfl: 3   nitrofurantoin, macrocrystal-monohydrate, (MACROBID) 100 MG capsule, Take 1 capsule (100 mg total) by mouth 2 (two) times daily., Disp: 10 capsule, Rfl: 0   predniSONE (DELTASONE) 50 MG tablet, Take one tablet daily x 5 days, Disp: 5 tablet, Rfl: 0   traZODone (DESYREL) 50 MG tablet, Take 0.5-1 tablets (25-50 mg total) by mouth at bedtime as needed for sleep., Disp: 90 tablet, Rfl: 2  No Known Allergies  Objective:   VITALS: Per patient if applicable, see vitals. GENERAL: Alert, appears well and in no acute distress. HEENT: Atraumatic, conjunctiva clear, no obvious abnormalities on inspection of external nose and ears. NECK: Normal movements of the head and neck. CARDIOPULMONARY: No increased WOB. Speaking in clear sentences. I:E ratio WNL.  MS: Moves all visible extremities without noticeable abnormality. PSYCH: Pleasant and cooperative, well-groomed. Speech normal rate and rhythm. Affect is appropriate. Insight  and judgement are appropriate. Attention is focused, linear, and appropriate.  NEURO: CN grossly intact. Oriented as arrived to appointment on time with no prompting. Moves both UE equally.  SKIN: No obvious lesions, wounds, erythema, or cyanosis noted on face or hands.  Assessment and Plan:   Lisa Colon was seen today for covid positive.  Diagnoses and all orders for this visit:  Chest pain on breathing -     CT Angio Chest  W/Cm &/Or Wo Cm; Future  Other orders -     predniSONE (DELTASONE) 50 MG tablet; Take one tablet daily x 5 days    No red flags on discussion, patient is not in any obvious distress during our visit.  Main concern is that her significant SOB has not improved much. Will start oral prednisone burst and also, out of an abundance of caution, send for CT angio to r/o PE.  Discussed over the counter supportive care options, with recommendations to push fluids and rest. Reviewed return precautions including new/worsening fever, SOB, new/worsening cough or other concerns.  Recommended need to self-quarantine and practice social distancing until symptoms resolve.  I recommend that patient follow-up if symptoms worsen or persist despite treatment x 7-10 days, sooner if needed.  I discussed the assessment and treatment plan with the patient. The patient was provided an opportunity to ask questions and all were answered. The patient agreed with the plan and demonstrated an understanding of the instructions.   The patient was advised to call back or seek an in-person evaluation if the symptoms worsen or if the condition fails to improve as anticipated.   CMA or LPN served as scribe during this visit. History, Physical, and Plan performed by medical provider. The above documentation has been reviewed and is accurate and complete.  [place], [place] 04/07/2020

## 2020-04-08 ENCOUNTER — Telehealth: Payer: Self-pay

## 2020-04-08 NOTE — Telephone Encounter (Signed)
Pt no showed for her CT angio this morning.

## 2020-04-08 NOTE — Telephone Encounter (Signed)
Samantha notified. 

## 2020-04-13 ENCOUNTER — Ambulatory Visit (HOSPITAL_COMMUNITY)
Admission: RE | Admit: 2020-04-13 | Discharge: 2020-04-13 | Disposition: A | Discharge status: Home / Self Care | Payer: BC Managed Care – PPO | Source: Ambulatory Visit | Attending: Physician Assistant | Admitting: Physician Assistant

## 2020-04-13 ENCOUNTER — Other Ambulatory Visit (HOSPITAL_COMMUNITY): Payer: BC Managed Care – PPO

## 2020-04-13 ENCOUNTER — Other Ambulatory Visit: Payer: Self-pay

## 2020-04-13 DIAGNOSIS — R071 Chest pain on breathing: Secondary | ICD-10-CM | POA: Insufficient documentation

## 2020-04-13 MED ORDER — IOHEXOL 350 MG/ML SOLN
100.0000 mL | Freq: Once | INTRAVENOUS | Status: AC | PRN
  Administered 2020-04-13: 100 mL via INTRAVENOUS

## 2020-04-19 ENCOUNTER — Other Ambulatory Visit: Payer: Self-pay | Admitting: Physician Assistant

## 2020-04-19 DIAGNOSIS — G4700 Insomnia, unspecified: Secondary | ICD-10-CM

## 2020-06-19 ENCOUNTER — Ambulatory Visit: Payer: BC Managed Care – PPO

## 2020-06-19 ENCOUNTER — Ambulatory Visit: Payer: BC Managed Care – PPO | Attending: Internal Medicine

## 2020-06-19 DIAGNOSIS — Z23 Encounter for immunization: Secondary | ICD-10-CM

## 2020-06-19 NOTE — Progress Notes (Signed)
   Covid-19 Vaccination Clinic  Name:  Lisa Colon    MRN: [id number] DOB: Apr 26, 1986  06/19/2020  [name] was observed post Covid-19 immunization for 15 minutes without incident. She was provided with Vaccine Information Sheet and instruction to access the V-Safe system.   [name] was instructed to call 911 with any severe reactions post vaccine: Marland Kitchen Difficulty breathing  . Swelling of face and throat  . A fast heartbeat  . A bad rash all over body  . Dizziness and weakness   Immunizations Administered    Name Date Dose VIS Date Route   Pfizer COVID-19 Vaccine 06/19/2020  4:07 PM 0.3 mL 04/01/2020 Intramuscular   Manufacturer: [employer], [employer]   Lot: [id number]   NDC: [address]-7

## 2020-07-07 ENCOUNTER — Encounter: Payer: Self-pay | Admitting: Physician Assistant

## 2020-07-13 ENCOUNTER — Encounter: Payer: Self-pay | Admitting: Physician Assistant

## 2020-07-13 ENCOUNTER — Other Ambulatory Visit: Payer: Self-pay

## 2020-07-13 ENCOUNTER — Other Ambulatory Visit: Payer: Self-pay | Admitting: Physician Assistant

## 2020-07-13 ENCOUNTER — Ambulatory Visit: Payer: BC Managed Care – PPO | Admitting: Physician Assistant

## 2020-07-13 VITALS — BP 110/70 | HR 92 | Temp 97.2°F | Ht 67.0 in | Wt 250.0 lb

## 2020-07-13 DIAGNOSIS — N912 Amenorrhea, unspecified: Secondary | ICD-10-CM | POA: Diagnosis not present

## 2020-07-13 DIAGNOSIS — O09291 Supervision of pregnancy with other poor reproductive or obstetric history, first trimester: Secondary | ICD-10-CM | POA: Diagnosis not present

## 2020-07-13 LAB — POCT URINE PREGNANCY: Preg Test, Ur: POSITIVE — AB

## 2020-07-13 LAB — HCG, QUANTITATIVE, PREGNANCY: Quantitative HCG: 1152.36 m[IU]/mL

## 2020-07-13 NOTE — Progress Notes (Signed)
Lisa Colon is a 35 y.o. female here for a new problem.  I acted as a Neurosurgeon for [employer], PA-C Corky Mull, LPN   History of Present Illness:   Chief Complaint  Patient presents with  . Amenorrhea    HPI   Amenorrhea Pt has missed her period, took 4 home pregnancy tests were positive. LMP was 05/31/2021. Having cramping and light bleeding last week for about a day. Around age 75 had a miscarriage. She is not sure how far along she was, but states that it was early, states that she has blocked this out of her memory.  Does not have a gynecologist.   Has not had any alcohol since her positive pregnancy test.  Taking prenatal irregularly.  Past Medical History:  Diagnosis Date  . Asthma      Social History   Tobacco Use  . Smoking status: Former Smoker    Packs/day: 0.50    Years: 9.00    Pack years: 4.50    Types: Cigarettes    Quit date: 06/13/2010    Years since quitting: 10.0  . Smokeless tobacco: Never Used  Vaping Use  . Vaping Use: Never used  Substance Use Topics  . Alcohol use: Yes    Alcohol/week: 20.0 standard drinks    Types: 20 Cans of beer per week  . Drug use: No    History reviewed. No pertinent surgical history.  Family History  Problem Relation Age of Onset  . ALS Mother   . COPD Mother   . Hypertension Mother   . Bipolar disorder Sister   . Depression Sister   . Breast cancer Maternal Grandmother   . Kidney disease Paternal Grandmother   . Heart disease Paternal Grandfather   . Diabetes Paternal Grandfather   . Colon cancer Neg Hx     No Known Allergies  Current Medications:   Current Outpatient Medications:  .  albuterol (VENTOLIN HFA) 108 (90 Base) MCG/ACT inhaler, Inhale 1-2 puffs into the lungs every 4 (four) hours as needed for wheezing or shortness of breath., Disp: 18 g, Rfl: 3 .  fluticasone-salmeterol (ADVAIR HFA) 115-21 MCG/ACT inhaler, Inhale 2 puffs into the lungs 2 (two) times daily., Disp: 3 Inhaler, Rfl:  3 .  traZODone (DESYREL) 50 MG tablet, TAKE 0.5-1 TABLETS BY MOUTH AT BEDTIME AS NEEDED FOR SLEEP., Disp: 90 tablet, Rfl: 2   Review of Systems:   ROS  Negative unless otherwise specified per HPI.  Vitals:   Vitals:   07/13/20 0739  BP: 110/70  Pulse: 92  Temp: (!) 97.2 F (36.2 C)  TempSrc: Temporal  SpO2: 96%  Weight: 250 lb (113.4 kg)  Height: 5\' 7"  (1.702 m)     Body mass index is 39.16 kg/m.  Physical Exam:   Physical Exam Vitals and nursing note reviewed.  Constitutional:      General: She is not in acute distress.    Appearance: She is well-developed. She is not ill-appearing, toxic-appearing or sickly-appearing.  Cardiovascular:     Rate and Rhythm: Normal rate and regular rhythm.     Pulses: Normal pulses.     Heart sounds: Normal heart sounds, S1 normal and S2 normal.     Comments: No LE edema Pulmonary:     Effort: Pulmonary effort is normal.     Breath sounds: Normal breath sounds.  Skin:    General: Skin is warm, dry and intact.  Neurological:     Mental Status: She is alert.  GCS: GCS eye subscore is 4. GCS verbal subscore is 5. GCS motor subscore is 6.  Psychiatric:        Mood and Affect: Mood and affect normal.        Speech: Speech normal.        Behavior: Behavior normal. Behavior is cooperative.     Results for orders placed or performed in visit on 07/13/20  POCT urine pregnancy  Result Value Ref Range   Preg Test, Ur Positive (A) Negative    Assessment and Plan:   Lisa Colon was seen today for amenorrhea.  Diagnoses and all orders for this visit:  Amenorrhea; History of miscarriage, currently pregnant, first trimester Asymptomatic today. Upreg positive in office. Due to history of miscarriage, will order beta-quant and repeat in 48 hours. However I am also putting in urgent referral to [facility] for further evaluation and management and should she have any immediate issues, to reach out them once established. Stop  trazodone. Start regular intake of prenatal. -     POCT urine pregnancy -     Ambulatory referral to Obstetrics / Gynecology -     B-HCG Quant   CMA or LPN served as scribe during this visit. History, Physical, and Plan performed by medical provider. The above documentation has been reviewed and is accurate and complete.  [name], PA-C

## 2020-07-13 NOTE — Patient Instructions (Signed)
It was great to see you!  Restart prenatal daily Continue to avoid all alcohol Stop trazodone at least during first trimesster  [facility] [address] [place], [place] [address] Phone: [phone]  I have asked about urgent referral to the above gynecology office.  We will do a beta quant lab today and repeat on Wednesday. Should you get in touch with the gynecologist before your next lab draw on Wednesday, you can cancel the lab appointment with me on Wednesday.  Take care,  [name] PA-C

## 2020-07-14 ENCOUNTER — Encounter: Payer: Self-pay | Admitting: Physician Assistant

## 2020-07-14 DIAGNOSIS — O009 Unspecified ectopic pregnancy without intrauterine pregnancy: Secondary | ICD-10-CM

## 2020-07-14 HISTORY — DX: Unspecified ectopic pregnancy without intrauterine pregnancy: O00.90

## 2020-07-15 ENCOUNTER — Inpatient Hospital Stay (HOSPITAL_COMMUNITY): Payer: BC Managed Care – PPO

## 2020-07-15 ENCOUNTER — Other Ambulatory Visit (INDEPENDENT_AMBULATORY_CARE_PROVIDER_SITE_OTHER): Payer: BC Managed Care – PPO

## 2020-07-15 ENCOUNTER — Encounter: Payer: Self-pay | Admitting: Physician Assistant

## 2020-07-15 ENCOUNTER — Inpatient Hospital Stay (HOSPITAL_COMMUNITY)
Admission: AD | Admit: 2020-07-15 | Discharge: 2020-07-15 | Disposition: A | Discharge status: Home / Self Care | Payer: BC Managed Care – PPO | Attending: Obstetrics and Gynecology | Admitting: Obstetrics and Gynecology

## 2020-07-15 ENCOUNTER — Encounter (HOSPITAL_COMMUNITY): Payer: Self-pay | Admitting: Obstetrics and Gynecology

## 2020-07-15 ENCOUNTER — Other Ambulatory Visit: Payer: Self-pay

## 2020-07-15 DIAGNOSIS — O26891 Other specified pregnancy related conditions, first trimester: Secondary | ICD-10-CM | POA: Insufficient documentation

## 2020-07-15 DIAGNOSIS — Z7951 Long term (current) use of inhaled steroids: Secondary | ICD-10-CM | POA: Diagnosis not present

## 2020-07-15 DIAGNOSIS — Z3A01 Less than 8 weeks gestation of pregnancy: Secondary | ICD-10-CM

## 2020-07-15 DIAGNOSIS — O09521 Supervision of elderly multigravida, first trimester: Secondary | ICD-10-CM | POA: Diagnosis not present

## 2020-07-15 DIAGNOSIS — O209 Hemorrhage in early pregnancy, unspecified: Secondary | ICD-10-CM | POA: Diagnosis present

## 2020-07-15 DIAGNOSIS — O360110 Maternal care for anti-D [Rh] antibodies, first trimester, not applicable or unspecified: Secondary | ICD-10-CM

## 2020-07-15 DIAGNOSIS — O09291 Supervision of pregnancy with other poor reproductive or obstetric history, first trimester: Secondary | ICD-10-CM | POA: Diagnosis not present

## 2020-07-15 DIAGNOSIS — O00101 Right tubal pregnancy without intrauterine pregnancy: Secondary | ICD-10-CM | POA: Insufficient documentation

## 2020-07-15 DIAGNOSIS — Z6791 Unspecified blood type, Rh negative: Secondary | ICD-10-CM

## 2020-07-15 DIAGNOSIS — R109 Unspecified abdominal pain: Secondary | ICD-10-CM | POA: Diagnosis not present

## 2020-07-15 DIAGNOSIS — O4691 Antepartum hemorrhage, unspecified, first trimester: Secondary | ICD-10-CM | POA: Diagnosis not present

## 2020-07-15 LAB — COMPREHENSIVE METABOLIC PANEL
ALT: 17 U/L (ref 0–44)
AST: 16 U/L (ref 15–41)
Albumin: 4.2 g/dL (ref 3.5–5.0)
Alkaline Phosphatase: 48 U/L (ref 38–126)
Anion gap: 10 (ref 5–15)
BUN: 6 mg/dL (ref 6–20)
CO2: 22 mmol/L (ref 22–32)
Calcium: 9 mg/dL (ref 8.9–10.3)
Chloride: 103 mmol/L (ref 98–111)
Creatinine, Ser: 0.82 mg/dL (ref 0.44–1.00)
GFR, Estimated: >60 mL/min (ref >60)
Glucose, Bld: 91 mg/dL (ref 70–99)
Potassium: 4 mmol/L (ref 3.5–5.1)
Sodium: 135 mmol/L (ref 135–145)
Total Bilirubin: 1.1 mg/dL (ref 0.3–1.2)
Total Protein: 7.3 g/dL (ref 6.5–8.1)

## 2020-07-15 LAB — CBC
HCT: 40.2 % (ref 36.0–46.0)
Hemoglobin: 13.3 g/dL (ref 12.0–15.0)
MCH: 30.4 pg (ref 26.0–34.0)
MCHC: 33.1 g/dL (ref 30.0–36.0)
MCV: 91.8 fL (ref 80.0–100.0)
Platelets: 337 10*3/uL (ref 150–400)
RBC: 4.38 MIL/uL (ref 3.87–5.11)
RDW: 11.8 % (ref 11.5–15.5)
WBC: 8.8 10*3/uL (ref 4.0–10.5)
nRBC: 0 % (ref 0.0–0.2)

## 2020-07-15 LAB — HCG, QUANTITATIVE, PREGNANCY
Quantitative HCG: 772.63 m[IU]/mL
hCG, Beta Chain, Quant, S: 857 m[IU]/mL — ABNORMAL HIGH (ref <5)

## 2020-07-15 LAB — ABO/RH: ABO/RH(D): A NEG

## 2020-07-15 MED ORDER — METHOTREXATE FOR ECTOPIC PREGNANCY: 50.0000 mg/m2 | Freq: Once | INTRAMUSCULAR | Status: DC

## 2020-07-15 MED ORDER — RHO D IMMUNE GLOBULIN 1500 UNIT/2ML IJ SOSY
300.0000 ug | PREFILLED_SYRINGE | Freq: Once | INTRAMUSCULAR | Status: AC
  Administered 2020-07-15: 300 ug via INTRAMUSCULAR
  Filled 2020-07-15: qty 2

## 2020-07-15 MED ORDER — METHOTREXATE SODIUM CHEMO INJECTION 250 MG/10ML
50.0000 mg/m2 | Freq: Once | INTRAMUSCULAR | Status: AC
  Administered 2020-07-15: 115 mg via INTRAMUSCULAR
  Filled 2020-07-15: qty 4.6

## 2020-07-15 NOTE — Telephone Encounter (Signed)
See other message

## 2020-07-15 NOTE — Telephone Encounter (Signed)
error 

## 2020-07-15 NOTE — Discharge Instructions (Signed)
Methotrexate Treatment for an Ectopic Pregnancy Methotrexate is a medicine that treats an ectopic pregnancy. In this type of pregnancy, the fertilized egg attaches (implants) outside the uterus. An ectopic pregnancy cannot develop into a healthy baby. Methotrexate works by stopping the growth of the fertilized egg. It also helps the body absorb tissue from the egg. This takes about 2-6 weeks. An ectopic pregnancy can be life-threatening. However, most ectopic pregnancies can be successfully treated with methotrexate if they are diagnosed early. Tell a health care provider about:  Any allergies you have.  All medicines you are taking, including vitamins, herbs, eye drops, creams, and over-the-counter medicines.  Any medical conditions you have. What are the risks? Generally, this is a safe treatment. However, problems may occur, including:  Digestive problems. You may have: ? Nausea. ? Vomiting. ? Diarrhea. ? Cramping in your abdomen.  Bleeding or spotting from your vagina.  Feeling dizzy or light-headed.  Mouth sores.  Inflammation of the lining of your lungs (pneumonitis).  Damage to nearby structures or organs, such as damage to the liver.  Hair loss. There is a risk that methotrexate treatment will fail and the pregnancy will continue. There is also a risk that the ectopic pregnancy might tear or burst (rupture) during use of this medicine. What happens before the procedure?  Blood tests will be done to check how your disease-fighting system (immune system), liver, and kidneys are working.  You will also have blood tests to measure your pregnancy hormone levels and to find out your blood type.  You will be given a shot of a medicine called Rho(D) immune globulin if: ? You are Rh-negative and the father is Rh-positive. ? You are Rh-negative and the father's Rh type is unknown. What happens during the procedure?  Methotrexate will be injected into your  muscle. ? Methotrexate may be given as a single dose of medicine or a series of doses over time, depending on your response to the treatment. ? Methotrexate injections are given by a health care provider. Injection is the most common way that this medicine is used to treat an ectopic pregnancy.  You may also receive other medicines to manage your ectopic pregnancy. The procedure may vary among health care providers and hospitals. What can I expect after treatment? After your treatment, it is common to have:  Cramping in your abdomen.  Bleeding in your vagina.  Tiredness (fatigue).  Nausea.  Vomiting.  Diarrhea. Blood tests will be done at timed intervals for several days or weeks to check your pregnancy hormone levels. The blood tests will be done until the pregnancy hormone can no longer be found in the blood. If the methotrexate treatment does not work, a surgical procedure may be done to remove the ectopic pregnancy. Follow these instructions at home: Medicines  Take over-the-counter and prescription medicines only as told by your health care provider.  Do not take prescription pain medicines, aspirin, ibuprofen, naproxen, or any other NSAIDs.  Do not take folic acid, prenatal vitamins, or other vitamins that contain folic acid. Activity  Do not have sex, douche, or put anything, such as tampons, in your vagina until your health care provider says it is okay.  Limit activities that take a lot of effort as told by your health care provider. General instructions  Do not drink alcohol.  Follow instructions from your health care provider about eating restrictions, such as avoiding foods that produce a lot of gas. These foods can hide the signs of a   ruptured ectopic pregnancy.  Limit exposure to sunlight or artificial UV light such as from tanning beds. Methotrexate can make you more sensitive to the sun.  Follow instructions from your health care provider on how and when to  report any symptoms that may indicate a ruptured ectopic pregnancy.  Keep all follow-up visits. This is important.   Contact a health care provider if:  You have persistent nausea and vomiting.  You have persistent diarrhea.  You are having a reaction to the medicine. This may include: ? Unusual fatigue. ? Skin rash. Get help right away if:  Pain in your abdomen or in the area between your hip bones (pelvic area) gets worse.  You have more bleeding from your vagina.  You feel light-headed or you faint.  You are short of breath.  Your heart rate increases.  You develop a cough.  You have chills or a fever. Summary  Methotrexate is a medicine that treats an ectopic pregnancy. This type of pregnancy forms outside the uterus.  There is a risk that methotrexate treatment will fail and the pregnancy will continue. There is also a risk that the ectopic pregnancy might tear or burst during use of this medicine.  This medicine may be given in a single dose or a series of doses over time.  After your treatment, blood tests will be done at timed intervals for several days or weeks to check your pregnancy hormone levels. The blood tests will be done until no more pregnancy hormone is found in the blood. This information is not intended to replace advice given to you by your health care provider. Make sure you discuss any questions you have with your health care provider. Document Revised: 11/13/2019 Document Reviewed: 11/13/2019 Elsevier Patient Education  2021 [employer] Inc.   Ectopic Pregnancy  An ectopic pregnancy happens when a fertilized egg attaches (implants) outside the uterus. In a normal pregnancy, a fertilized egg implants in the uterus. An ectopic pregnancy cannot develop into a healthy baby. Most ectopic pregnancies occur in one of the fallopian tubes, which is where an egg travels from an ovary to get to the uterus. This is called a tubal pregnancy. An ectopic pregnancy  can also happen on an ovary, on the cervix, or in the abdomen. When a fertilized egg implants on tissue outside the uterus and begins to grow, it may cause the tissue to tear or burst. This is known as a ruptured ectopic pregnancy. The tear or burst causes internal bleeding. This may cause intense pain in the abdomen. An ectopic pregnancy is a medical emergency and can be life-threatening. What are the causes? The most common cause of this condition is damage to one of the fallopian tubes. A fallopian tube may be narrowed or blocked, and that stops the fertilized egg from reaching the uterus. Sometimes, the cause of this condition is not known. What increases the risk? The following factors may make you more likely to develop this condition:  Having gone through infertility treatment before.  Having had an ectopic pregnancy before.  Having had surgery to have the fallopian tubes tied.  Becoming pregnant while using an intrauterine device for birth control.  Taking birth control pills before the age of 16. Other risk factors include:  Smoking.  Alcohol use.  History of DES exposure. DES is a medicine that was used until 1971 and affected babies whose mothers took the medicine. What are the signs or symptoms? Common symptoms of this condition include:  Missing   a menstrual period.  Nausea or tiredness.  Tender breasts.  Other normal pregnancy symptoms. Other symptoms may include:  Pain during sex.  Vaginal bleeding or spotting.  Cramping or pain in the lower abdomen.  A fast heartbeat, low blood pressure, and sweating.  Pain or increased pressure while having a bowel movement. Symptoms of a ruptured ectopic pregnancy and internal bleeding may include:  Sudden, severe pain in the abdomen.  Dizziness, weakness, feeling light-headed, or fainting.  Pain in the shoulder or neck area. How is this diagnosed? This condition is diagnosed by:  A blood test to check for the  pregnancy hormone.  A pelvic exam to find painful areas or a mass in the abdomen.  Ultrasound. A probe is inserted into the vagina to see if there is a pregnancy in or outside the uterus.  Taking a sample of tissue from the uterus.  Surgery to look closely at the fallopian tubes through an incision in the abdomen. How is this treated? This condition is usually treated with medicine or surgery. Sometimes, ectopic pregnancies can resolve on their own, under close monitoring by your health care provider. Medicine A medicine called methotrexate may be given to cause the pregnancy tissue to be absorbed. The medicine may be given if:  The diagnosis is made early, with no signs of active bleeding.  The fallopian tube has not torn or burst. You will need blood tests to make sure the medicine is working. It may take 4-6 weeks for the pregnancy tissues to be absorbed. Surgery Surgery may be performed to:  Remove the pregnancy tissue.  Stop internal bleeding.  Remove part or all of the fallopian tube.  Remove the uterus. This is rare. After surgery, you may need to have blood tests to make sure the surgery worked. Follow these instructions at home: Medicines  Take over-the-counter and prescription medicines only as told by your health care provider.  Ask your health care provider if the medicine prescribed to you: ? Requires you to avoid driving or using machinery. ? Can cause constipation. You may need to take these actions to prevent or treat constipation:  Drink enough fluid to keep your urine pale yellow.  Take over-the-counter or prescription medicines.  Eat foods that are high in fiber, such as beans, whole grains, and fresh fruits and vegetables.  Limit foods that are high in fat and processed sugars, such as fried or sweet foods. General instructions  Rest or limit your activity, if told by your health care provider.  Do not have sex or put anything in your vagina, such  as tampons or douches, for 6 weeks or until your health care provider says it is safe.  Do not lift anything that is heavier than 10 lb (4.5 kg), or the limit that you are told, until your health care provider says that it is safe.  Return to your normal activities as told by your health care provider. Ask your health care provider what activities are safe for you.  Keep all follow-up visits. This is important. Contact a health care provider if:  You have a fever or chills.  You have nausea and vomiting. Get help right away if:  Your pain gets worse or is not relieved by medicine.  You feel dizzy or weak.  You feel light-headed or you faint.  You have sudden, severe pain in your abdomen.  You have sudden pain in the shoulder or neck area. Summary  An ectopic pregnancy happens when a   fertilized egg implants outside the uterus. Most ectopic pregnancies occur in one of the fallopian tubes.  An ectopic pregnancy is a medical emergency and can be life-threatening.  The most common cause of this condition is damage to one of the fallopian tubes.  This condition is usually treated with medicine or surgery. Some ectopic pregnancies resolve on their own, under close monitoring by your health care provider. This information is not intended to replace advice given to you by your health care provider. Make sure you discuss any questions you have with your health care provider. Document Revised: 09/10/2019 Document Reviewed: 09/10/2019 Elsevier Patient Education  2021 Elsevier Inc.  

## 2020-07-15 NOTE — MAU Note (Signed)
.   Lisa Colon is a 35 y.o. at [redacted]w[redacted]d here in MAU reporting: Sent from primary care due to fall in [place]. Is having some vaginal bleeding but it is light, denies pain now but was having lower left sided cramping last night. No recent intercourse. Has not had an [place].  LMP: 05/31/20  Pain score: 0 Vitals:   07/15/20 1222  BP: (!) 143/88  Pulse: 91  Resp: 15  Temp: 98.3 F (36.8 C)  SpO2: 98%

## 2020-07-15 NOTE — MAU Provider Note (Signed)
History     CSN: [id number]  Arrival date and time: 07/15/20 1211   Event Date/Time   First Provider Initiated Contact with Patient 07/15/20 1232      Chief Complaint  Patient presents with  . Vaginal Bleeding  . Abdominal Pain   HPI Lisa Colon is a 35 y.o. G2P0020 at [redacted]w[redacted]d by LMP who presents for abdominal cramping & vaginal bleeding. Had HCG checked by her PCP; was 1152 on 1/31 & 772 this morning. Reports lower abdominal cramping last night that has since resolved without intervention. Currently rates pain 0/10. Had some bright red bleeding last night and pink spotting this morning. Denies any other symptoms.   OB History    Gravida  2   Para      Term      Preterm      AB  2   Living        SAB  1   IAB      Ectopic  1   Multiple      Live Births              Past Medical History:  Diagnosis Date  . Asthma     History reviewed. No pertinent surgical history.  Family History  Problem Relation Age of Onset  . ALS Mother   . COPD Mother   . Hypertension Mother   . Bipolar disorder Sister   . Depression Sister   . Breast cancer Maternal Grandmother   . Kidney disease Paternal Grandmother   . Heart disease Paternal Grandfather   . Diabetes Paternal Grandfather   . Colon cancer Neg Hx     Social History   Tobacco Use  . Smoking status: Former Smoker    Packs/day: 0.50    Years: 9.00    Pack years: 4.50    Types: Cigarettes    Quit date: 06/13/2010    Years since quitting: 10.0  . Smokeless tobacco: Never Used  Vaping Use  . Vaping Use: Never used  Substance Use Topics  . Alcohol use: Yes    Alcohol/week: 20.0 standard drinks    Types: 20 Cans of beer per week  . Drug use: No    Allergies: No Known Allergies  Medications Prior to Admission  Medication Sig Dispense Refill Last Dose  . albuterol (VENTOLIN HFA) 108 (90 Base) MCG/ACT inhaler Inhale 1-2 puffs into the lungs every 4 (four) hours as needed for wheezing or shortness of  breath. 18 g 3   . fluticasone-salmeterol (ADVAIR HFA) 115-21 MCG/ACT inhaler Inhale 2 puffs into the lungs 2 (two) times daily. 3 Inhaler 3   . traZODone (DESYREL) 50 MG tablet TAKE 0.5-1 TABLETS BY MOUTH AT BEDTIME AS NEEDED FOR SLEEP. 90 tablet 2     Review of Systems  Constitutional: Negative.   Gastrointestinal: Positive for abdominal pain (none currently).  Genitourinary: Positive for vaginal bleeding.   Physical Exam   Blood pressure 133/90, pulse 91, temperature 98.3 F (36.8 C), temperature source Oral, resp. rate 15, last menstrual period 05/31/2020, SpO2 98 %.  Physical Exam Vitals and nursing note reviewed.  Constitutional:      General: She is not in acute distress.    Appearance: She is well-developed.  HENT:     Head: Normocephalic and atraumatic.  Pulmonary:     Effort: Pulmonary effort is normal. No respiratory distress.  Abdominal:     General: There is no distension.     Palpations: Abdomen is soft.  Tenderness: There is no abdominal tenderness.  Skin:    General: Skin is warm and dry.  Neurological:     Mental Status: She is alert.     MAU Course  Procedures Results for orders placed or performed during the hospital encounter of 07/15/20 (from the past 24 hour(s))  CBC     Status: None   Collection Time: 07/15/20 12:47 PM  Result Value Ref Range   WBC 8.8 4.0 - 10.5 K/uL   RBC 4.38 3.87 - 5.11 MIL/uL   Hemoglobin 13.3 12.0 - 15.0 g/dL   HCT 94.7 65.4 - 65.0 %   MCV 91.8 80.0 - 100.0 fL   MCH 30.4 26.0 - 34.0 pg   MCHC 33.1 30.0 - 36.0 g/dL   RDW 35.4 65.6 - 81.2 %   Platelets 337 150 - 400 K/uL   nRBC 0.0 0.0 - 0.2 %  ABO/Rh     Status: None   Collection Time: 07/15/20 12:47 PM  Result Value Ref Range   ABO/RH(D) A NEG   hCG, quantitative, pregnancy     Status: Abnormal   Collection Time: 07/15/20 12:47 PM  Result Value Ref Range   hCG, Beta Chain, Quant, S 857 (H) <5 mIU/mL  Rh IG workup (includes ABO/Rh)     Status: None (Preliminary  result)   Collection Time: 07/15/20 12:47 PM  Result Value Ref Range   Gestational Age(Wks) 6    ABO/RH(D) A NEG    Antibody Screen NEG    Unit Number [id number]    Blood Component Type RHIG    Unit division 00    Status of Unit ISSUED    Transfusion Status      OK TO TRANSFUSE Performed at [facility], [address], [place], [place] [address]   Comprehensive metabolic panel     Status: None   Collection Time: 07/15/20 12:47 PM  Result Value Ref Range   Sodium 135 135 - 145 mmol/L   Potassium 4.0 3.5 - 5.1 mmol/L   Chloride 103 98 - 111 mmol/L   CO2 22 22 - 32 mmol/L   Glucose, Bld 91 70 - 99 mg/dL   BUN 6 6 - 20 mg/dL   Creatinine, Ser 6.38 0.44 - 1.00 mg/dL   Calcium 9.0 8.9 - 46.6 mg/dL   Total Protein 7.3 6.5 - 8.1 g/dL   Albumin 4.2 3.5 - 5.0 g/dL   AST 16 15 - 41 U/L   ALT 17 0 - 44 U/L   Alkaline Phosphatase 48 38 - 126 U/L   Total Bilirubin 1.1 0.3 - 1.2 mg/dL   GFR, Estimated >59 >93 mL/min   Anion gap 10 5 - 15    MDM Per review of records; patient had drop in HCG but not greater than >50%, sent from office for further workup.    CBC, ABO/Rh, quant hCG, and [place] today to rule out ectopic pregnancy which can be life threatening.   HCG here is 857 Ultrasound shows right adnexal mass suspicious for ectopic pregnancy measuring 3.3 cm, no signs of hemoperitoneum Discussed patient with Dr. Jolayne Panther; she is a candidate for methotrexate treatment  The risks of methotrexate were reviewed including failure requiring repeat dosing or eventual surgery. She understands that methotrexate involves frequent return visits to monitor lab values and that she remains at risk of ectopic rupture until her beta is less than assay. ?The patient opts to proceed with methotrexate.  She has no history of hepatic or renal  dysfunction, has normal BUN/Cr/LFT's/platelets.  She is felt to be reliable for follow-up. Side effects of photosensitivity & GI upset were discussed.  She  knows to avoid direct sunlight and abstain from alcohol, aspirin and aspirin-like products for two weeks. She was counseled to discontinue any MVI with folic acid. ?She understands to follow up on D4 (Saturday) and D7 (Tuesday) for repeat BHCG and was given the instruction sheet. Strict ectopic precautions were reviewed, the patient knows to call with any abdominal pain, vomiting, fainting, or any concerns with her health.  Rh-, Rhogam administered.   Assessment and Plan   1. Right tubal pregnancy without intrauterine pregnancy   2. Vaginal bleeding in pregnancy, first trimester   3. Rh negative status during pregnancy in first trimester   4. [redacted] weeks gestation of pregnancy    -return to MAU Saturday for day 4 HCG and should be scheduled in office for day 7 HCG on Tuesday depending on day 4 results -reviewed reasons to return to MAU & s/s of ruptured ectopic   Lisa Colon 07/15/2020, 3:36 PM

## 2020-07-16 LAB — RH IG WORKUP (INCLUDES ABO/RH)
ABO/RH(D): A NEG
Antibody Screen: NEGATIVE
Gestational Age(Wks): 6
Unit division: 0

## 2020-07-18 ENCOUNTER — Inpatient Hospital Stay (HOSPITAL_COMMUNITY): Payer: BC Managed Care – PPO

## 2020-07-18 ENCOUNTER — Other Ambulatory Visit: Payer: Self-pay

## 2020-07-18 ENCOUNTER — Inpatient Hospital Stay (HOSPITAL_COMMUNITY)
Admission: AD | Admit: 2020-07-18 | Discharge: 2020-07-18 | Disposition: A | Discharge status: Home / Self Care | Payer: BC Managed Care – PPO | Attending: Obstetrics and Gynecology | Admitting: Obstetrics and Gynecology

## 2020-07-18 DIAGNOSIS — O009 Unspecified ectopic pregnancy without intrauterine pregnancy: Secondary | ICD-10-CM | POA: Diagnosis not present

## 2020-07-18 DIAGNOSIS — Z79899 Other long term (current) drug therapy: Secondary | ICD-10-CM

## 2020-07-18 DIAGNOSIS — Z5181 Encounter for therapeutic drug level monitoring: Secondary | ICD-10-CM | POA: Diagnosis not present

## 2020-07-18 DIAGNOSIS — R109 Unspecified abdominal pain: Secondary | ICD-10-CM | POA: Diagnosis not present

## 2020-07-18 DIAGNOSIS — Z7951 Long term (current) use of inhaled steroids: Secondary | ICD-10-CM | POA: Insufficient documentation

## 2020-07-18 DIAGNOSIS — O00201 Right ovarian pregnancy without intrauterine pregnancy: Secondary | ICD-10-CM | POA: Diagnosis not present

## 2020-07-18 DIAGNOSIS — Z87891 Personal history of nicotine dependence: Secondary | ICD-10-CM | POA: Diagnosis not present

## 2020-07-18 LAB — HCG, QUANTITATIVE, PREGNANCY: hCG, Beta Chain, Quant, S: 349 m[IU]/mL — ABNORMAL HIGH (ref <5)

## 2020-07-18 NOTE — Discharge Instructions (Signed)
Ectopic Pregnancy  An ectopic pregnancy happens when a fertilized egg grows outside of the womb (uterus). Fertilized means that sperm entered the egg. The egg cannot stay alive outside of the womb. What are the causes? The most common cause is damage to a fallopian tube. This damage stops the egg from getting to the womb. Instead, the egg stays in the tube. Sometimes, an ectopic pregnancy happens in other parts of the body. What increases the risk?  Getting treatment before to help you have a baby.  A past pregnancy outside of the womb.  A past surgery to have your tubes tied.  Getting pregnant while using a device in the womb to avoid getting pregnant.  Taking birth control pills before the age of 69.  Smoking or drinking alcohol.  Having a mother who took a medicine called DES many years ago. What are the signs or symptoms? Common symptoms of this condition include:  Missing a menstrual period.  Feeling like you may vomit.  Tiredness.  Breast pain.  Other signs that you are pregnant. Other symptoms may include:  Pain during sex.  Bleeding from the vagina.  Belly pain.  A fast heartbeat, low blood pressure, and sweating.  Pain or extra pressure while pooping (having a bowel movement). If your tube tears or bursts:  You may have sudden and very bad pain in your belly.  You may feel dizzy, weak, or light-headed.  You may faint.  You may have pain in your shoulder or neck. A torn or burst tube is an emergency. It can be life-threatening. How is this treated? This condition may be treated with:  Medicine. This may be given if: ? The pregnancy is found early and you are not bleeding. ? The tube has not torn or burst.  Surgery. This may be done to: ? Take out the pregnancy tissue. ? Stop bleeding. ? Take out part or all of the tube. ? Take out the womb. This is rare.  Blood tests.  Careful watching. Follow these instructions at home: Medicines  Take  over-the-counter and prescription medicines only as told by your doctor.  If told, take steps to prevent problems with pooping (constipation). You may need to: ? Drink enough fluid to keep your pee (urine) pale yellow. ? Take medicines. You will be told what medicines to take. ? Eat foods that are high in fiber. These include beans, whole grains, and fresh fruits and vegetables. ? Limit foods that are high in fat and sugar. These include fried or sweet foods.  Ask your doctor if you should avoid driving or using machines while you are taking your medicine. General instructions  Rest or limit your activities, if told to do this.  Do not have sex for 6 weeks or as told by your doctor.  Do not put tampons, vaginal cleaning wash (douche), or other things in your vagina. Do not use these things for 6 weeks or until your doctor says it is safe to use them.  Do not lift anything that is heavier than 10 lb (4.5 kg), or the limit that you are told.  Return to your normal activities when your doctor says that it is safe.  Keep all follow-up visits. Contact a doctor if:  You have a fever or chills.  You feel like you may vomit and you vomit. Get help right away if:  Your pain gets worse or is not helped by medicine.  You feel dizzy or weak.  You feel  light-headed.  You faint.  You have sudden and very bad pain in your belly.  You have very bad pain in your shoulder or neck. Summary  An ectopic pregnancy happens when a fertilized egg grows outside the womb.  This is an emergency.  The most common cause is damage to one of the fallopian tubes.  This condition may be treated with medicine, surgery, blood tests, or careful watching. This information is not intended to replace advice given to you by your health care provider. Make sure you discuss any questions you have with your health care provider. Document Revised: 09/20/2019 Document Reviewed: 09/10/2019 Elsevier Patient  Education  2021 [employer].  

## 2020-07-18 NOTE — MAU Provider Note (Addendum)
History     CSN: [id number]  Arrival date and time: 07/18/20 3382   Event Date/Time   First Provider Initiated Contact with Patient 07/18/20 [phone]      Chief Complaint  Patient presents with  . Abdominal Pain  . Follow-up   Lisa Colon is a 35 y.o. G2P0020 with known Ectopic Pregnancy.  She presents today for Abdominal Pain and Follow-up.  Patient is Day 4 s/p MTX and reports onset of "different sharp pain" in her abdominal area.  Patient reports taking tylenol with some improvement, but no resolution of pain. She states the pain is aggravated by coughing, but improved with knee to chest position.  She endorses continued vaginal bleeding and reports one incident of vomiting last night. She rates the pain a 7/10 and states "it comes and goes in waves."  Patient denies current pain, but states when she stands up and walks she feels something.    OB History     Gravida  2   Para      Term      Preterm      AB  2   Living         SAB  1   IAB      Ectopic  1   Multiple      Live Births              Past Medical History:  Diagnosis Date  . Asthma     No past surgical history on file.  Family History  Problem Relation Age of Onset  . ALS Mother   . COPD Mother   . Hypertension Mother   . Bipolar disorder Sister   . Depression Sister   . Breast cancer Maternal Grandmother   . Kidney disease Paternal Grandmother   . Heart disease Paternal Grandfather   . Diabetes Paternal Grandfather   . Colon cancer Neg Hx     Social History   Tobacco Use  . Smoking status: Former Smoker    Packs/day: 0.50    Years: 9.00    Pack years: 4.50    Types: Cigarettes    Quit date: 06/13/2010    Years since quitting: 10.1  . Smokeless tobacco: Never Used  Vaping Use  . Vaping Use: Never used  Substance Use Topics  . Alcohol use: Yes    Alcohol/week: 20.0 standard drinks    Types: 20 Cans of beer per week  . Drug use: No    Allergies: No Known  Allergies  Medications Prior to Admission  Medication Sig Dispense Refill Last Dose  . albuterol (VENTOLIN HFA) 108 (90 Base) MCG/ACT inhaler Inhale 1-2 puffs into the lungs every 4 (four) hours as needed for wheezing or shortness of breath. 18 g 3   . fluticasone-salmeterol (ADVAIR HFA) 115-21 MCG/ACT inhaler Inhale 2 puffs into the lungs 2 (two) times daily. 3 Inhaler 3   . traZODone (DESYREL) 50 MG tablet TAKE 0.5-1 TABLETS BY MOUTH AT BEDTIME AS NEEDED FOR SLEEP. 90 tablet 2     Review of Systems  Gastrointestinal: Positive for abdominal pain ( "My whole stomach") and vomiting. Negative for constipation, diarrhea and nausea.  Genitourinary: Positive for vaginal bleeding. Negative for difficulty urinating, dysuria, pelvic pain and vaginal discharge.  Neurological: Negative for dizziness, light-headedness and headaches.   Physical Exam   Blood pressure (!) 132/94, pulse 92, temperature 98.2 F (36.8 C), resp. rate 17, last menstrual period 05/31/2020.  Physical Exam Vitals reviewed.  Constitutional:  Appearance: Normal appearance. She is well-developed.  HENT:     Head: Normocephalic and atraumatic.  Eyes:     Conjunctiva/sclera: Conjunctivae normal.  Cardiovascular:     Rate and Rhythm: Normal rate.  Pulmonary:     Effort: Pulmonary effort is normal.  Abdominal:     Palpations: Abdomen is soft.     Tenderness: There is abdominal tenderness in the right upper quadrant, right lower quadrant and suprapubic area. There is no guarding.  Musculoskeletal:        General: Normal range of motion.  Skin:    General: Skin is warm.  Neurological:     Mental Status: She is alert and oriented to person, place, and time.  Psychiatric:        Mood and Affect: Mood normal.        Behavior: Behavior normal.        Thought Content: Thought content normal.     MAU Course  Procedures No results found for this or any previous visit (from the past 24 hour(s)).  US OB  Transvaginal  Result Date: 07/18/2020 CLINICAL DATA:  Known ectopic pregnancy post methotrexate, with abdominal pain. EXAM: TRANSVAGINAL OB ULTRASOUND TECHNIQUE: Transvaginal ultrasound was performed for complete evaluation of the gestation as well as the maternal uterus, adnexal regions, and pelvic cul-de-sac. COMPARISON:  Three days ago FINDINGS: Findings remain compatible with ectopic pregnancy on the right where there is a extra ovarian mass in the absence of a intrauterine sac. The mass measures up to 2.6 x 2.2 x 2.7 cm, mildly improved from before when it measured up to 3.3 cm. The degree of color Doppler signal is stable and limited. No visible hemoperitoneum, only borderline hypoechoic fluid in the rectovaginal recess. IMPRESSION: Decreased size of the presumed right adnexal ectopic pregnancy (2.7 cm compared to 3.3 cm before). No visible hemoperitoneum. Electronically Signed   By: Marnee Spring M.D.   On: 07/18/2020 08:19    MDM Pelvic Exam Labs:hCG Ultrasound Assessment and Plan  35 year old G2P0020 Known Ectopic Day 4 S/P MTX Abdominal Pain  -Exam performed and findings discussed. -Dr. Austin Miles consulted and agrees with sending patient to [place] for further evaluation. -hCG ordered and collected. -Will await results.   Cherre Robins 07/18/2020, 7:17 AM   Reassessment (8:28 AM) Report given to K. Crisoforo Oxford, CNM  Cherre Robins MSN, CNM Advanced Practice Provider, [facility]  Beta HCG has dropped from 857 to 349, indicating an appropriate drop in quant and that MTX is having an effect. Mass on [place] is now smaller in size.  Patient stable for discharge with strict ectopic precautions on when to return to hospital (increased pain, bleeding that is getting worse) Appt made for patient to have follow up at [facility] at 8:30 on 2/8; patient given information and instructions on where to go and what plan of care is (confirm drop in bhcg, if rise or uncertain may need another  dose of MTX).  All questions answered  Luna Kitchens

## 2020-07-18 NOTE — MAU Note (Signed)
Patient here for repeat hcg levels.  Also experiencing an increase in her lower abdominal cramping that comes and goes.  Reports her bleeding has increased slightly as well.

## 2020-07-21 ENCOUNTER — Other Ambulatory Visit: Payer: BC Managed Care – PPO

## 2020-07-22 ENCOUNTER — Ambulatory Visit: Payer: BC Managed Care – PPO

## 2020-07-27 ENCOUNTER — Other Ambulatory Visit: Payer: Self-pay

## 2020-07-27 ENCOUNTER — Ambulatory Visit (INDEPENDENT_AMBULATORY_CARE_PROVIDER_SITE_OTHER): Payer: BC Managed Care – PPO

## 2020-07-27 VITALS — BP 140/103 | HR 99 | Ht 62.0 in | Wt 252.0 lb

## 2020-07-27 DIAGNOSIS — Z5181 Encounter for therapeutic drug level monitoring: Secondary | ICD-10-CM | POA: Diagnosis not present

## 2020-07-27 DIAGNOSIS — Z79899 Other long term (current) drug therapy: Secondary | ICD-10-CM | POA: Diagnosis not present

## 2020-07-27 DIAGNOSIS — O209 Hemorrhage in early pregnancy, unspecified: Secondary | ICD-10-CM | POA: Diagnosis not present

## 2020-07-27 LAB — BETA HCG QUANT (REF LAB): hCG Quant: 6 m[IU]/mL

## 2020-07-27 NOTE — Progress Notes (Signed)
Pt here today for STAT Beta Hcg. Pt was seen at MAU on 07/18/20.  Last Beta Hcg 349 on 07/18/20. Given MTX on 07/15/2020 at MAU.   Pt advised will call with results today and plan of care. Pt states ok to leave detailed message on voicemail if no answer.   Pt agreeable and verbalized understanding.  Pt denies any  cramps/abd pain at this time. States only having light pink spotting when wiping only. Not wearing a pad or panty liner. Pt advised normal and continue to monitor. Pt verbalized understanding. Pt advised ectopic pregnancy  precautions and to go to MAU with heavy bleeding or severe abd pain. Pt agreeable.    Results reviewed with Dr Alysia Penna. Advised to have pt repeat non-Stat Beta in two weeks.  Call placed to pt and given details left on VM. Pt to return call with any questions or concerns. Pt given numbers to return call on VM.   Judeth Cornfield, RN  07/27/20

## 2020-07-27 NOTE — Progress Notes (Signed)
Agree with A & P. 

## 2020-08-07 ENCOUNTER — Other Ambulatory Visit: Payer: Self-pay | Admitting: *Deleted

## 2020-08-07 DIAGNOSIS — O00101 Right tubal pregnancy without intrauterine pregnancy: Secondary | ICD-10-CM

## 2020-08-07 NOTE — Progress Notes (Signed)
hcg 

## 2020-08-10 ENCOUNTER — Other Ambulatory Visit: Payer: BC Managed Care – PPO

## 2021-01-18 ENCOUNTER — Other Ambulatory Visit: Payer: Self-pay | Admitting: Physician Assistant

## 2021-01-18 DIAGNOSIS — J4540 Moderate persistent asthma, uncomplicated: Secondary | ICD-10-CM

## 2021-01-18 NOTE — Telephone Encounter (Signed)
Please see message from pharmacy.

## 2021-01-19 ENCOUNTER — Other Ambulatory Visit: Payer: Self-pay | Admitting: Physician Assistant

## 2021-01-19 MED ORDER — ALBUTEROL SULFATE HFA 108 (90 BASE) MCG/ACT IN AERS: 1.0000 | INHALATION_SPRAY | RESPIRATORY_TRACT | 3 refills | Status: DC | PRN

## 2021-01-27 ENCOUNTER — Other Ambulatory Visit: Payer: Self-pay | Admitting: Physician Assistant

## 2021-01-27 DIAGNOSIS — G4700 Insomnia, unspecified: Secondary | ICD-10-CM

## 2021-02-09 ENCOUNTER — Ambulatory Visit: Payer: BC Managed Care – PPO | Admitting: Physician Assistant

## 2021-02-09 ENCOUNTER — Other Ambulatory Visit: Payer: Self-pay

## 2021-02-09 ENCOUNTER — Encounter: Payer: Self-pay | Admitting: Physician Assistant

## 2021-02-09 VITALS — BP 150/100 | HR 90 | Temp 97.9°F | Ht 62.0 in | Wt 254.0 lb

## 2021-02-09 DIAGNOSIS — H6123 Impacted cerumen, bilateral: Secondary | ICD-10-CM

## 2021-02-09 DIAGNOSIS — M2559 Pain in other specified joint: Secondary | ICD-10-CM | POA: Diagnosis not present

## 2021-02-09 DIAGNOSIS — R059 Cough, unspecified: Secondary | ICD-10-CM | POA: Diagnosis not present

## 2021-02-09 DIAGNOSIS — L659 Nonscarring hair loss, unspecified: Secondary | ICD-10-CM | POA: Diagnosis not present

## 2021-02-09 DIAGNOSIS — J4530 Mild persistent asthma, uncomplicated: Secondary | ICD-10-CM | POA: Diagnosis not present

## 2021-02-09 LAB — CBC WITH DIFFERENTIAL/PLATELET
Basophils Absolute: 0.1 10*3/uL (ref 0.0–0.1)
Basophils Relative: 1 % (ref 0.0–3.0)
Eosinophils Absolute: 0.5 10*3/uL (ref 0.0–0.7)
Eosinophils Relative: 6.1 % — ABNORMAL HIGH (ref 0.0–5.0)
HCT: 37.3 % (ref 36.0–46.0)
Hemoglobin: 12.4 g/dL (ref 12.0–15.0)
Lymphocytes Relative: 25.1 % (ref 12.0–46.0)
Lymphs Abs: 2 10*3/uL (ref 0.7–4.0)
MCHC: 33.2 g/dL (ref 30.0–36.0)
MCV: 86.9 fl (ref 78.0–100.0)
Monocytes Absolute: 0.4 10*3/uL (ref 0.1–1.0)
Monocytes Relative: 5.5 % (ref 3.0–12.0)
Neutro Abs: 4.9 10*3/uL (ref 1.4–7.7)
Neutrophils Relative %: 62.3 % (ref 43.0–77.0)
Platelets: 271 10*3/uL (ref 150.0–400.0)
RBC: 4.3 Mil/uL (ref 3.87–5.11)
RDW: 15.1 % (ref 11.5–15.5)
WBC: 8 10*3/uL (ref 4.0–10.5)

## 2021-02-09 LAB — T4, FREE: Free T4: 0.56 ng/dL — ABNORMAL LOW (ref 0.60–1.60)

## 2021-02-09 LAB — TSH: TSH: 3.74 u[IU]/mL (ref 0.35–5.50)

## 2021-02-09 MED ORDER — MELOXICAM 7.5 MG PO TABS: 7.5000 mg | ORAL_TABLET | Freq: Every day | ORAL | 0 refills | Status: DC

## 2021-02-09 MED ORDER — ALBUTEROL SULFATE HFA 108 (90 BASE) MCG/ACT IN AERS: 1.0000 | INHALATION_SPRAY | RESPIRATORY_TRACT | 3 refills | Status: DC | PRN

## 2021-02-09 MED ORDER — METHYLPREDNISOLONE ACETATE 80 MG/ML IJ SUSP
80.0000 mg | Freq: Once | INTRAMUSCULAR | Status: AC
  Administered 2021-02-09: 80 mg via INTRAMUSCULAR

## 2021-02-09 MED ORDER — MONTELUKAST SODIUM 10 MG PO TABS
10.0000 mg | ORAL_TABLET | Freq: Every day | ORAL | 3 refills | Status: DC
Start: 2021-02-09 — End: 2021-04-26

## 2021-02-09 NOTE — Progress Notes (Signed)
Lisa Colon is a 35 y.o. female here for a new problem.  I acted as a Neurosurgeon for [employer], PA-C [employer], LPN   History of Present Illness:   Chief Complaint  Patient presents with   Thinning hair   Joint Pain   Cough    HPI   Thinning hair Pt c/o thinning hair for the past few months. Noticing forehead hair line is back further. Has tried over the counter biotin without relief of sx. patient reports that she had COVID in October of last year and also she did experience an ectopic pregnancy earlier this year.  She has not tried anything topically for her symptoms.   Joint pain Pt c/o joint pain in knees, wrists and fingers pt has had for it for years but has been getting worse past 2-3 months. Pt is using Advil duo action, Magnesium. Using keyboard more. Fingers, wrist and knees. Advil helps.  Denies any known history of family autoimmune disorder, but does comment that her mom possibly had RA.  Denies any joint swelling or redness.   Cough Pt c/o dry non-productive cough x 3 weeks. Denies fever or chills, chest pain. Pt is using Advair inhaler but not controlling cough. Some SOB throughout the day.  She is currently out of her albuterol inhaler.  She states that if she did have it she would probably be using it almost on a daily basis at this point.  Bilateral cerumen impaction Patient reports that she has significant wax in both ears, has a feeling of fullness and difficulty hearing at times.   Past Medical History:  Diagnosis Date   Asthma    Ectopic pregnancy without intrauterine pregnancy 07/2020     Social History   Tobacco Use   Smoking status: Former    Packs/day: 0.50    Years: 9.00    Pack years: 4.50    Types: Cigarettes    Quit date: 06/13/2010    Years since quitting: 10.6   Smokeless tobacco: Never  Vaping Use   Vaping Use: Never used  Substance Use Topics   Alcohol use: Yes    Alcohol/week: 20.0 standard drinks    Types: 20 Cans of  beer per week   Drug use: No    History reviewed. No pertinent surgical history.  Family History  Problem Relation Age of Onset   ALS Mother    COPD Mother    Hypertension Mother    Other Mother        ?RA   Bipolar disorder Sister    Depression Sister    Breast cancer Maternal Grandmother    Kidney disease Paternal Grandmother    Heart disease Paternal Grandfather    Diabetes Paternal Grandfather    Colon cancer Neg Hx     No Known Allergies  Current Medications:   Current Outpatient Medications:    ADVAIR HFA 115-21 MCG/ACT inhaler, TAKE 2 PUFFS BY MOUTH TWICE A DAY, Disp: 36 each, Rfl: 3   meloxicam (MOBIC) 7.5 MG tablet, Take 1 tablet (7.5 mg total) by mouth daily., Disp: 30 tablet, Rfl: 0   montelukast (SINGULAIR) 10 MG tablet, Take 1 tablet (10 mg total) by mouth at bedtime., Disp: 30 tablet, Rfl: 3   traZODone (DESYREL) 50 MG tablet, TAKE 0.5-1 TABLETS BY MOUTH AT BEDTIME AS NEEDED FOR SLEEP., Disp: 90 tablet, Rfl: 1   albuterol (VENTOLIN HFA) 108 (90 Base) MCG/ACT inhaler, Inhale 1-2 puffs into the lungs every 4 (four) hours as needed  for wheezing or shortness of breath., Disp: 54 g, Rfl: 3   Review of Systems:   ROS Negative unless otherwise specified per HPI.   Vitals:   Vitals:   02/09/21 0804  BP: (!) 150/100  Pulse: 90  Temp: 97.9 F (36.6 C)  TempSrc: Temporal  SpO2: 98%  Weight: 254 lb (115.2 kg)  Height: 5\' 2"  (1.575 m)     Body mass index is 46.46 kg/m.  Physical Exam:   Physical Exam Vitals and nursing note reviewed.  Constitutional:      General: She is not in acute distress.    Appearance: She is well-developed. She is not ill-appearing or toxic-appearing.  HENT:     Head: Normocephalic and atraumatic.     Right Ear: Tympanic membrane, ear canal and external ear normal. There is impacted cerumen.     Left Ear: Tympanic membrane, ear canal and external ear normal. There is impacted cerumen.     Nose: Nose normal.     Right Sinus: No  maxillary sinus tenderness or frontal sinus tenderness.     Left Sinus: No maxillary sinus tenderness or frontal sinus tenderness.     Mouth/Throat:     Pharynx: Uvula midline. No posterior oropharyngeal erythema.  Eyes:     General: Lids are normal.     Conjunctiva/sclera: Conjunctivae normal.  Neck:     Trachea: Trachea normal.  Cardiovascular:     Rate and Rhythm: Normal rate and regular rhythm.     Heart sounds: Normal heart sounds, S1 normal and S2 normal.  Pulmonary:     Effort: Pulmonary effort is normal.     Breath sounds: Examination of the right-middle field reveals decreased breath sounds. Examination of the right-lower field reveals decreased breath sounds. Decreased breath sounds present. No wheezing, rhonchi or rales.  Lymphadenopathy:     Cervical: No cervical adenopathy.  Skin:    General: Skin is warm and dry.  Neurological:     Mental Status: She is alert.  Psychiatric:        Speech: Speech normal.        Behavior: Behavior normal. Behavior is cooperative.   Ceruminosis is noted.  Wax is removed by syringing and manual debridement. Instructions for home care to prevent wax buildup are given.  Assessment and Plan:   Ricardo was seen today for thinning hair, joint pain and cough.  Diagnoses and all orders for this visit:  Hair thinning No red flags on exam We will update thyroid and iron levels to check for organic cause of symptoms Discussed that this is likely a temporary response to COVID, stress, recent miscarriage, among others Consider Rogaine versus referral to dermatology -     TSH -     T4, free -     CBC with Differential/Platelet -     Iron, TIBC and Ferritin Panel  Cough; Mild persistent asthma without complication Suspect recent URI and uncontrolled asthma No red flags on my exam today Prednisone injection Refill albuterol, continue Advair Add 10 mg Singulair at night --black box warning discussed today regarding rare suicidal ideation on  medication.  Recommended patient tell husband that she is starting this medication and to monitor for possible symptoms.  Patient verbalized understanding of plan.  Pain in other joint Declines any autoimmune testing today Suspect osteoarthritis Recommend considering over-the-counter turmeric Trial Mobic 7.5 mg daily I instructed patient that if she were to take this medication consistently that she needs to take an over-the-counter antacid, such  as Prilosec or Pepcid to help prevent gastritis  Bilateral cerumen impaction Ear lavage was performed today and tolerated well No signs of infection after procedure  Other orders -     meloxicam (MOBIC) 7.5 MG tablet; Take 1 tablet (7.5 mg total) by mouth daily. -     montelukast (SINGULAIR) 10 MG tablet; Take 1 tablet (10 mg total) by mouth at bedtime. -     albuterol (VENTOLIN HFA) 108 (90 Base) MCG/ACT inhaler; Inhale 1-2 puffs into the lungs every 4 (four) hours as needed for wheezing or shortness of breath.   CMA or LPN served as scribe during this visit. History, Physical, and Plan performed by medical provider. The above documentation has been reviewed and is accurate and complete.   [name], PA-C

## 2021-02-09 NOTE — Patient Instructions (Addendum)
It was great to see you!  Prednisone injection today to help with your cough and joint pain  -May start oral mobic 7.5 mg as soon as tomorrow -If you take this for >3 days in a row, please start over the counter oral antacid such as pepcid or prilosec (generic is fine)  Update thyroid and iron levels today to assess for abnormalities that could be contributing to hair thinning  I will refill inhalers  Start Singulair 10mg  daily at night.  Cautioned that in some children/adults can experience behavioral changes including hyperactivity, agitation, depression, sleep disturbances and suicidal ideations. These side effects are rare, but if you notice them you should notify me and discontinue Singulair (montelukast).  If no improvement in symptoms, please let me know.  Take care,  PA-C

## 2021-02-10 LAB — IRON,TIBC AND FERRITIN PANEL
%SAT: 19 % (calc) (ref 16–45)
Ferritin: 11 ng/mL — ABNORMAL LOW (ref 16–154)
Iron: 77 ug/dL (ref 40–190)
TIBC: 409 mcg/dL (calc) (ref 250–450)

## 2021-03-06 ENCOUNTER — Other Ambulatory Visit: Payer: Self-pay | Admitting: Physician Assistant

## 2021-03-15 ENCOUNTER — Telehealth: Payer: Self-pay

## 2021-03-15 NOTE — Telephone Encounter (Signed)
Patient received flu vac on 9/24 at [facility] on [address].   Is requesting chart to be updated.

## 2021-03-15 NOTE — Telephone Encounter (Signed)
Left message on voicemail to call office. Need to clarify what shot she got on 9/24. Flu or COVID?  In her chart under immunizations it has she had her COVID vaccine?

## 2021-03-18 NOTE — Telephone Encounter (Signed)
Noted  

## 2021-04-05 ENCOUNTER — Other Ambulatory Visit: Payer: Self-pay | Admitting: Physician Assistant

## 2021-04-24 ENCOUNTER — Other Ambulatory Visit: Payer: Self-pay | Admitting: Physician Assistant

## 2021-04-28 ENCOUNTER — Other Ambulatory Visit: Payer: Self-pay | Admitting: Physician Assistant

## 2021-05-26 ENCOUNTER — Other Ambulatory Visit: Payer: Self-pay | Admitting: Physician Assistant

## 2021-06-08 ENCOUNTER — Other Ambulatory Visit: Payer: Self-pay

## 2021-06-08 ENCOUNTER — Telehealth (INDEPENDENT_AMBULATORY_CARE_PROVIDER_SITE_OTHER): Payer: BC Managed Care – PPO | Admitting: Physician Assistant

## 2021-06-08 ENCOUNTER — Encounter: Payer: Self-pay | Admitting: Physician Assistant

## 2021-06-08 VITALS — Ht 62.0 in | Wt 254.0 lb

## 2021-06-08 DIAGNOSIS — R051 Acute cough: Secondary | ICD-10-CM | POA: Diagnosis not present

## 2021-06-08 MED ORDER — PREDNISONE 20 MG PO TABS: 40.0000 mg | ORAL_TABLET | Freq: Every day | ORAL | 0 refills | Status: DC

## 2021-06-08 MED ORDER — AZITHROMYCIN 250 MG PO TABS: ORAL_TABLET | ORAL | 0 refills | Status: AC

## 2021-06-08 NOTE — Progress Notes (Signed)
Virtual Visit via Video Note   I, [name], connected with  Lisa Colon  ([id number], December 19, 1985) on 06/08/21 at 11:00 AM EST by a video-enabled telemedicine application and verified that I am speaking with the correct person using two identifiers.  Location: Patient: Virtual Visit Location Patient: Home Provider: Virtual Visit Location Provider: Office/Clinic   I discussed the limitations of evaluation and management by telemedicine and the availability of in person appointments. The patient expressed understanding and agreed to proceed.    History of Present Illness: Lisa Colon is a 35 y.o. who identifies as a female who was assigned female at birth, and is being seen today for cough.  HPI: HPI   Patient reports cough x 2 weeks. Went to [employer] around Thanksgiving and has had cough since. She has had some chest tightness and wheezing. She is an asthmatic and uses her Advair and Singulair regularly. She reports that she has had to use her albuterol inhaler max 2 times a day but not consistently. She denies: fevers, chills, body aches, n/v, SOB. Cough is deep and getting worse with time.   Problems:  Patient Active Problem List   Diagnosis Date Noted   Asthma 10/25/2016   Chronic left-sided low back pain 10/25/2016   Class 2 obesity due to excess calories in adult 10/25/2016    Allergies: No Known Allergies Medications:  Current Outpatient Medications:    ADVAIR HFA 115-21 MCG/ACT inhaler, TAKE 2 PUFFS BY MOUTH TWICE A DAY, Disp: 36 each, Rfl: 3   albuterol (VENTOLIN HFA) 108 (90 Base) MCG/ACT inhaler, Inhale 1-2 puffs into the lungs every 4 (four) hours as needed for wheezing or shortness of breath., Disp: 54 g, Rfl: 3   azithromycin (ZITHROMAX) 250 MG tablet, Take 2 tablets on day 1, then 1 tablet daily on days 2 through 5, Disp: 6 tablet, Rfl: 0   meloxicam (MOBIC) 7.5 MG tablet, TAKE 1 TABLET BY MOUTH EVERY DAY, Disp: 30 tablet, Rfl: 1   montelukast (SINGULAIR) 10  MG tablet, TAKE 1 TABLET BY MOUTH EVERYDAY AT BEDTIME, Disp: 90 tablet, Rfl: 1   predniSONE (DELTASONE) 20 MG tablet, Take 2 tablets (40 mg total) by mouth daily., Disp: 10 tablet, Rfl: 0   traZODone (DESYREL) 50 MG tablet, TAKE 0.5-1 TABLETS BY MOUTH AT BEDTIME AS NEEDED FOR SLEEP., Disp: 90 tablet, Rfl: 1  Observations/Objective: Patient is well-developed, well-nourished in no acute distress.  Resting comfortably  at home.  Head is normocephalic, atraumatic.  No labored breathing.  Speech is clear and coherent with logical content.  Patient is alert and oriented at baseline.    Assessment and Plan: 1. Acute cough No red flags on discussion.  Will initiate azithromcyin and oral prednisone per orders for asthmatic bronchitis. Discussed taking medications as prescribed. Reviewed return precautions including worsening fever, SOB, worsening cough or other concerns. Push fluids and rest. I recommend that patient follow-up if symptoms worsen or persist despite treatment x 7-10 days, sooner if needed.   Follow Up Instructions: I discussed the assessment and treatment plan with the patient. The patient was provided an opportunity to ask questions and all were answered. The patient agreed with the plan and demonstrated an understanding of the instructions.  A copy of instructions were sent to the patient via MyChart unless otherwise noted below.   The patient was advised to call back or seek an in-person evaluation if the symptoms worsen or if the condition fails to improve as anticipated.   [name],  PA

## 2021-06-10 ENCOUNTER — Telehealth: Payer: BC Managed Care – PPO | Admitting: Physician Assistant

## 2021-08-02 ENCOUNTER — Other Ambulatory Visit: Payer: Self-pay | Admitting: Obstetrics and Gynecology

## 2021-08-02 DIAGNOSIS — O00101 Right tubal pregnancy without intrauterine pregnancy: Secondary | ICD-10-CM

## 2021-08-06 ENCOUNTER — Other Ambulatory Visit: Payer: Self-pay | Admitting: Physician Assistant

## 2021-08-07 ENCOUNTER — Other Ambulatory Visit: Payer: Self-pay | Admitting: Physician Assistant

## 2021-08-07 DIAGNOSIS — G4700 Insomnia, unspecified: Secondary | ICD-10-CM

## 2021-09-10 ENCOUNTER — Other Ambulatory Visit: Payer: Self-pay | Admitting: Physician Assistant

## 2021-10-16 ENCOUNTER — Other Ambulatory Visit: Payer: Self-pay | Admitting: Physician Assistant

## 2021-11-09 ENCOUNTER — Other Ambulatory Visit: Payer: Self-pay | Admitting: Physician Assistant

## 2021-11-09 DIAGNOSIS — G4700 Insomnia, unspecified: Secondary | ICD-10-CM

## 2021-12-18 ENCOUNTER — Other Ambulatory Visit: Payer: Self-pay | Admitting: Physician Assistant

## 2022-01-19 ENCOUNTER — Other Ambulatory Visit: Payer: Self-pay | Admitting: Physician Assistant

## 2022-01-24 IMAGING — CT CT ANGIO CHEST
2 of 6 series · 18 of 46 positions shown · IV contrast (APPLIED)
Comparison: None.

CLINICAL DATA: PE suspected, high prob

Cough and shortness of breath status post COVID. Chest pain on
breathing.
EXAM:
CT ANGIOGRAPHY CHEST WITH CONTRAST
TECHNIQUE: Multidetector CT imaging of the chest was performed using the
standard protocol during bolus administration of intravenous
contrast. Multiplanar CT image reconstructions and MIPs were
obtained to evaluate the vascular anatomy.
CONTRAST:  100mL OMNIPAQUE IOHEXOL 350 MG/ML SOLN

[Series 5: thins · axial · 0.78mm/px · z∈[+1024,+1280]mm · 16 of 283 slices shown]
[im 13/283  lung]
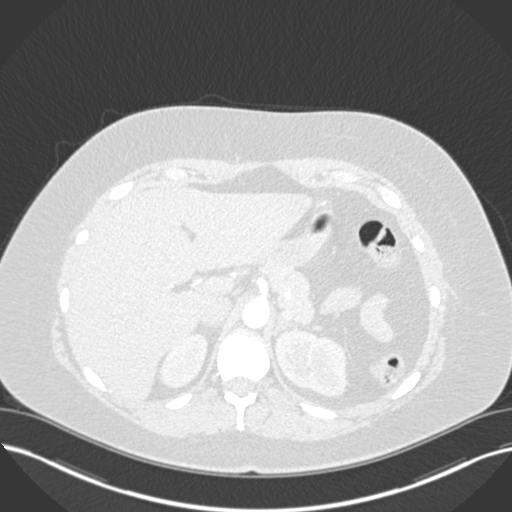
[im 37/283  soft-tissue]
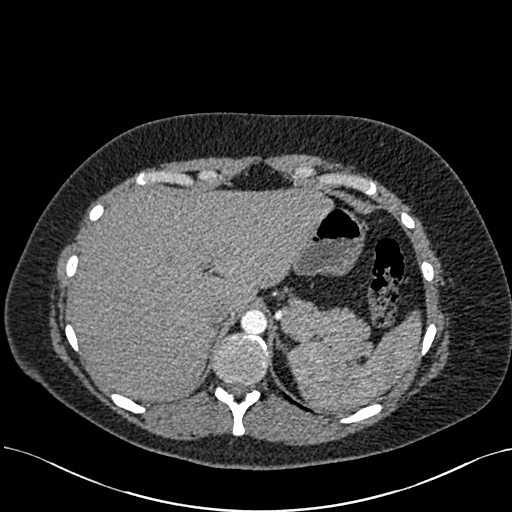
[im 50/283  lung]
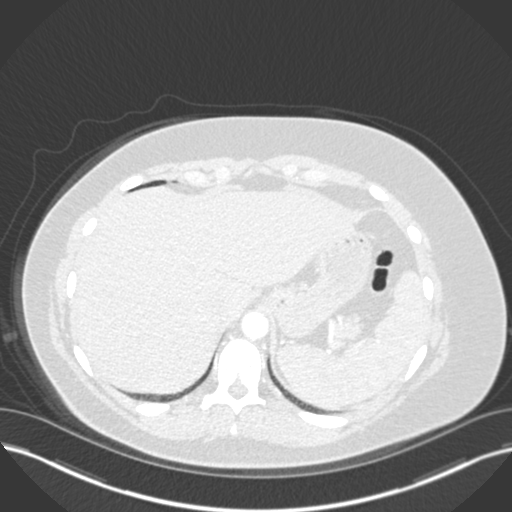
[im 62/283  soft-tissue]
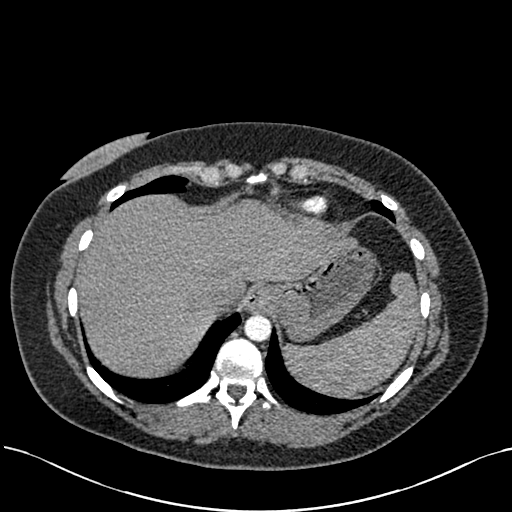
[im 86/283  lung]
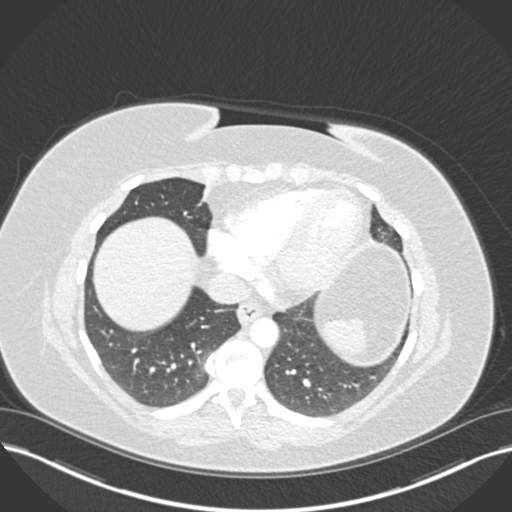
[im 99/283  soft-tissue]
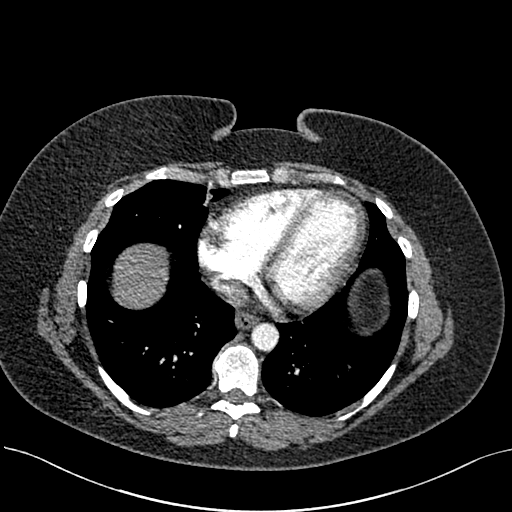
[im 111/283  lung]
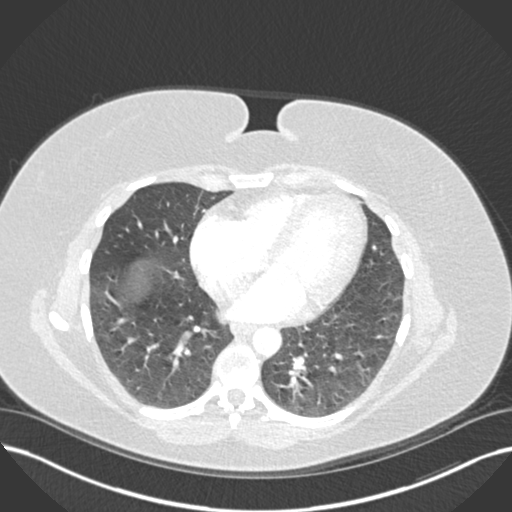
[im 135/283  soft-tissue]
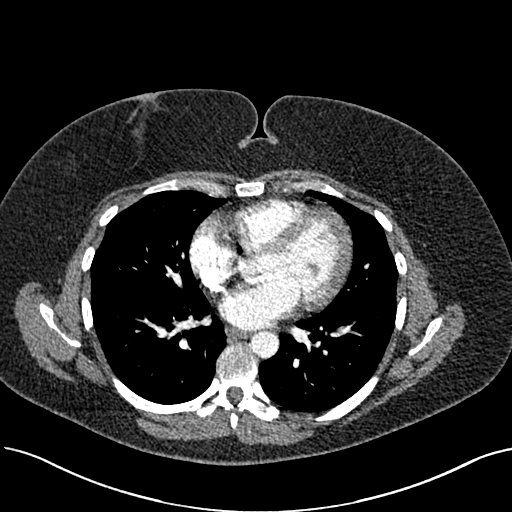
[im 148/283  lung]
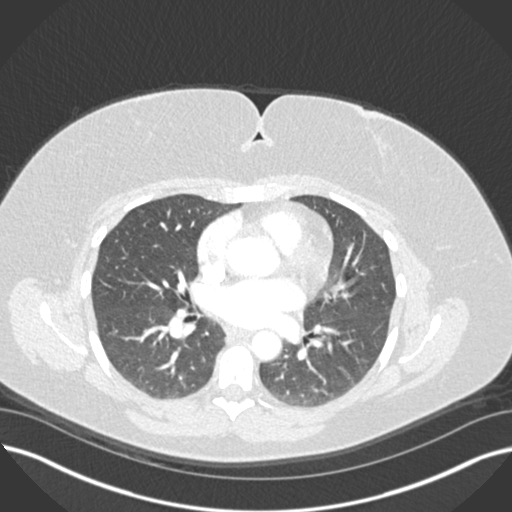
[im 172/283  soft-tissue]
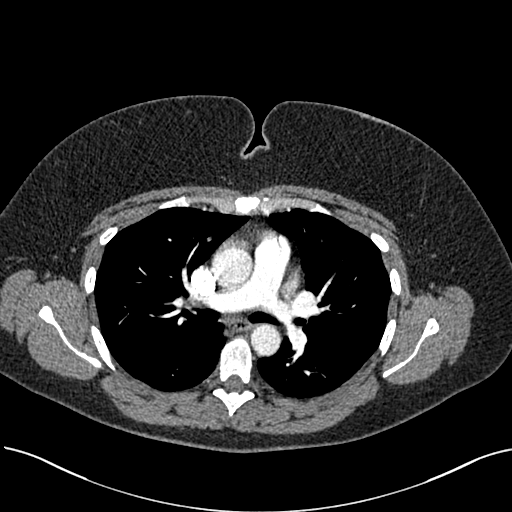
[im 184/283  lung]
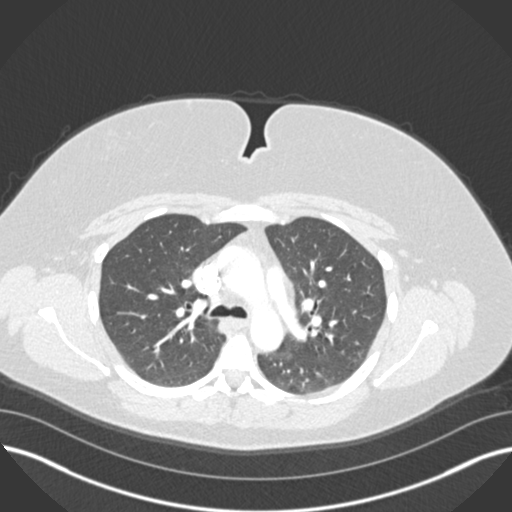
[im 197/283  soft-tissue]
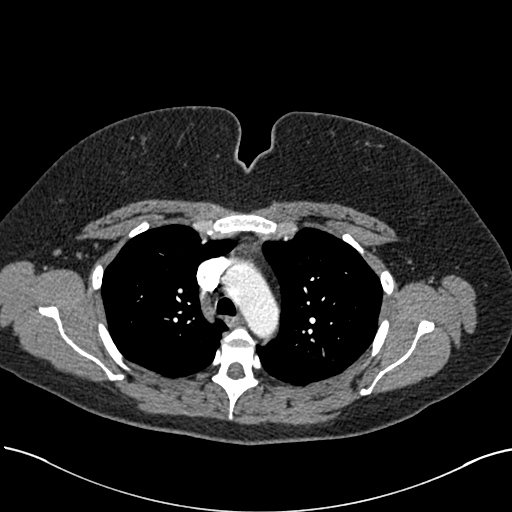
[im 221/283  lung]
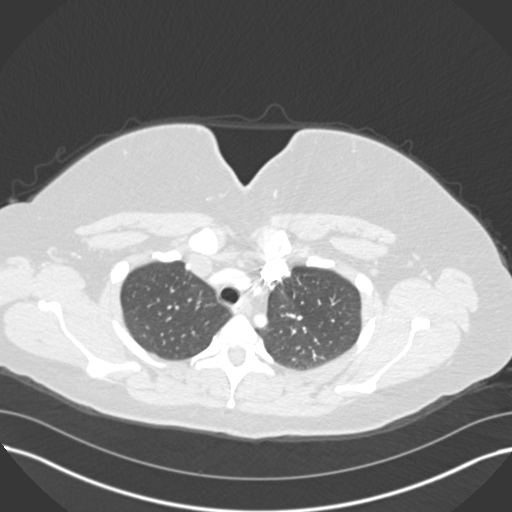
[im 233/283  soft-tissue]
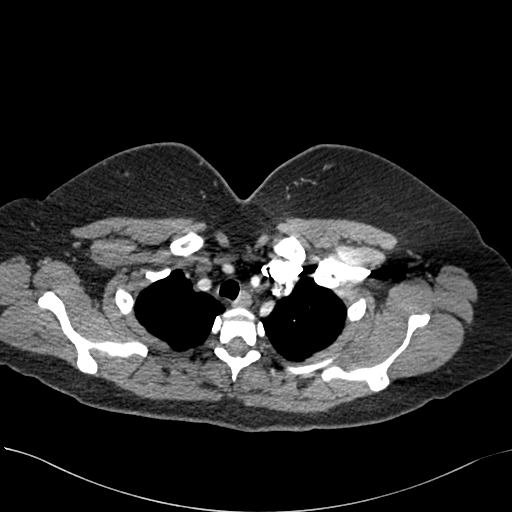
[im 246/283  lung]
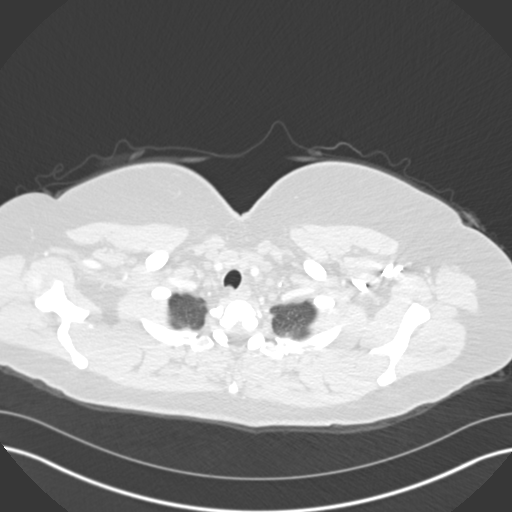
[im 270/283  soft-tissue]
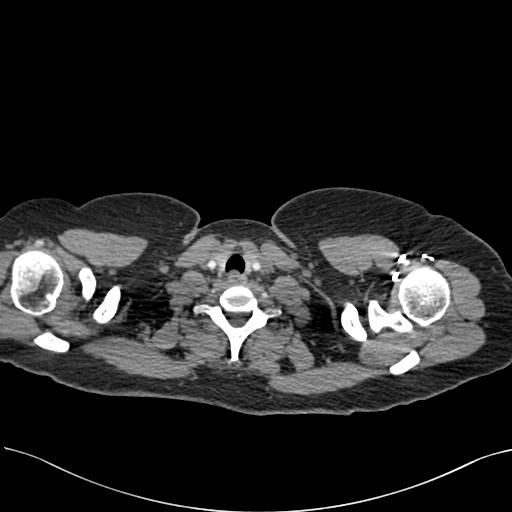

[Series 6: coronal mpr · coronal · 0.55mm/px · 2 of 100 slices shown]
[im 34/100  soft-tissue]
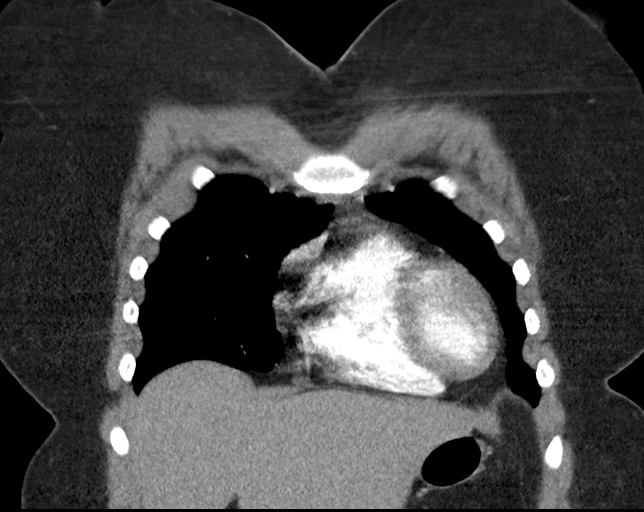
[im 67/100  soft-tissue]
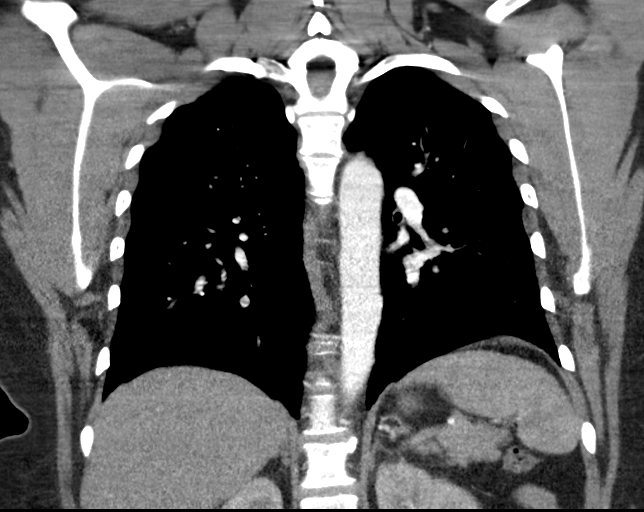

[18 of 46 positions shown; findings below may reference images not displayed]

FINDINGS: Cardiovascular: There are no filling defects within the pulmonary
arteries to suggest pulmonary embolus. Subsegmental branches are not
well assessed due to breathing motion artifact and soft tissue
attenuation related to habitus. Thoracic aorta is normal in caliber
without dissection. Conventional branching pattern from the aortic
arch. The heart is normal in size. There is no pericardial effusion.

Mediastinum/Nodes: No mediastinal or hilar adenopathy. Decompressed
esophagus with tiny hiatal hernia. No pneumomediastinum. No
visualized thyroid nodule.

Lungs/Pleura: No focal consolidation or ground-glass opacity to
suggest 5T375-KA pneumonia. The lungs are clear allowing for mild
motion artifact. No findings of pulmonary edema. There is no pleural
fluid. The trachea and central bronchi are patent.

Upper Abdomen: No acute or unexpected findings

Musculoskeletal: There are no acute or suspicious osseous
abnormalities. Small Schmorl's nodes in the lower thoracic spine.

Review of the MIP images confirms the above findings.
IMPRESSION: 1. No pulmonary embolus or acute intrathoracic abnormality. No
sequela of COVID pneumonia.
2. Tiny hiatal hernia.

## 2022-01-27 ENCOUNTER — Other Ambulatory Visit: Payer: Self-pay | Admitting: Physician Assistant

## 2022-01-27 DIAGNOSIS — J4540 Moderate persistent asthma, uncomplicated: Secondary | ICD-10-CM

## 2022-02-12 ENCOUNTER — Other Ambulatory Visit: Payer: Self-pay | Admitting: Physician Assistant

## 2022-02-12 DIAGNOSIS — G4700 Insomnia, unspecified: Secondary | ICD-10-CM

## 2022-03-16 ENCOUNTER — Other Ambulatory Visit: Payer: Self-pay | Admitting: Physician Assistant

## 2022-03-16 DIAGNOSIS — G4700 Insomnia, unspecified: Secondary | ICD-10-CM

## 2022-04-27 IMAGING — US US OB < 14 WEEKS - US OB TV
1 series · 15 of 28 positions shown · non-contrast
Comparison: None.

CLINICAL DATA: Vaginal bleeding in 1st trimester pregnancy.
Decreasing beta hCG level.

EXAM:
OBSTETRIC <14 WK US AND TRANSVAGINAL OB US
TECHNIQUE: Both transabdominal and transvaginal ultrasound examinations were
performed for complete evaluation of the gestation as well as the
maternal uterus, adnexal regions, and pelvic cul-de-sac.
Transvaginal technique was performed to assess early pregnancy.

[Series 1: us ob < 14 weeks - us ob tv · 15 of 67 slices shown]
[im 1/67]
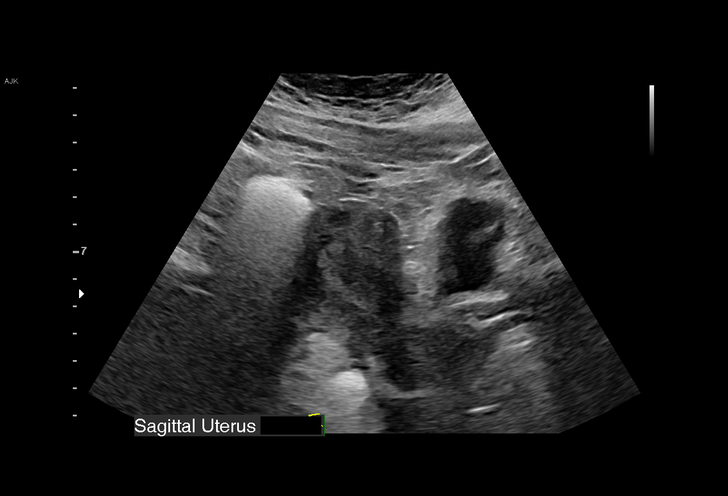
[im 5/67]
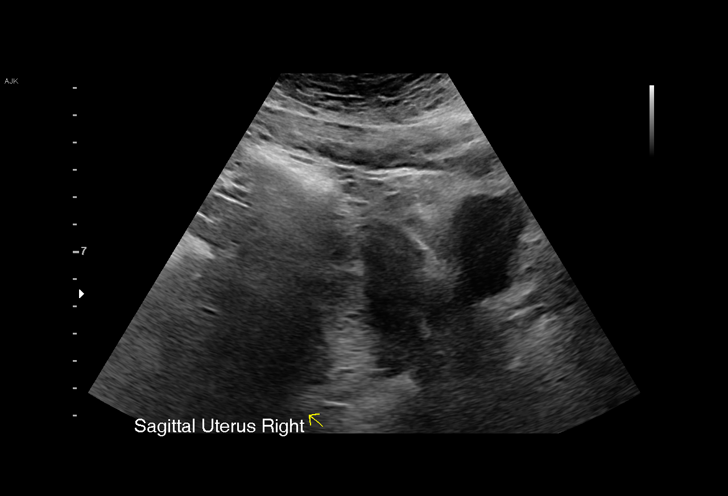
[im 10/67]
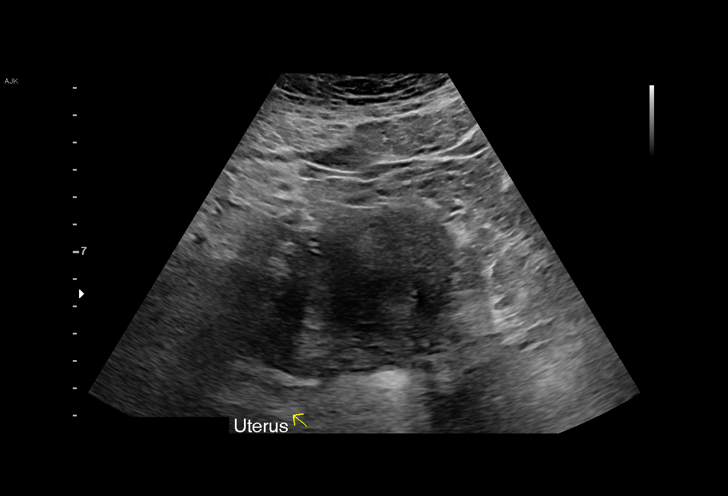
[im 15/67]
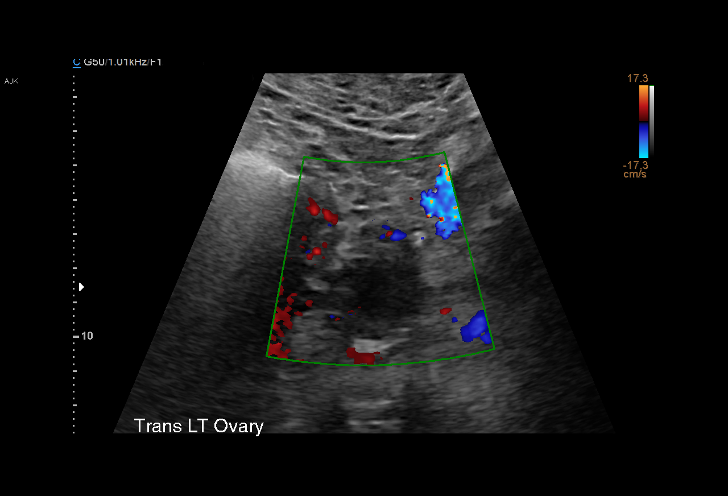
[im 20/67]
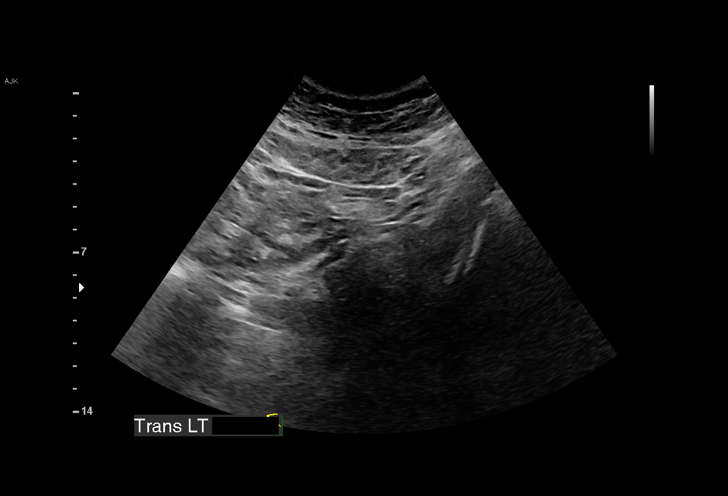
[im 25/67]
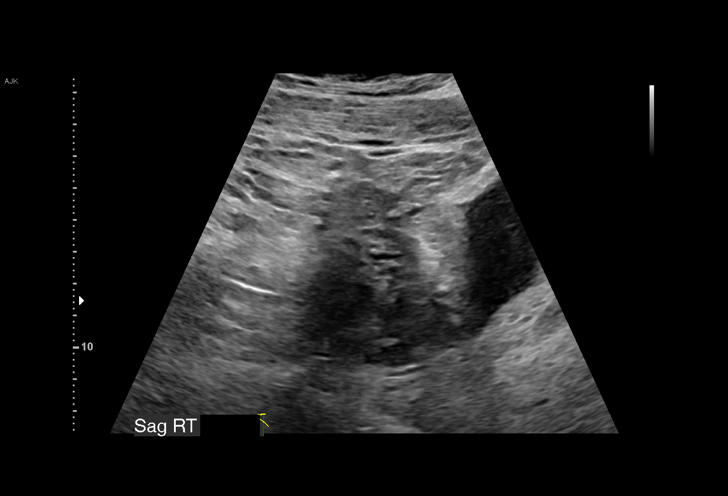
[im 30/67]
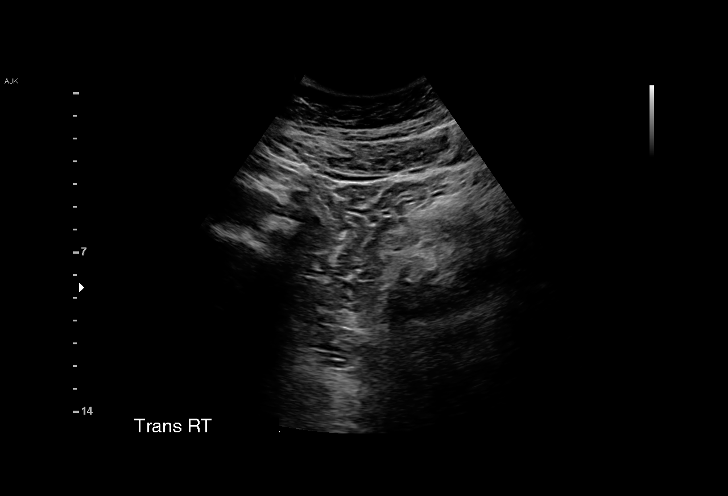
[im 35/67]
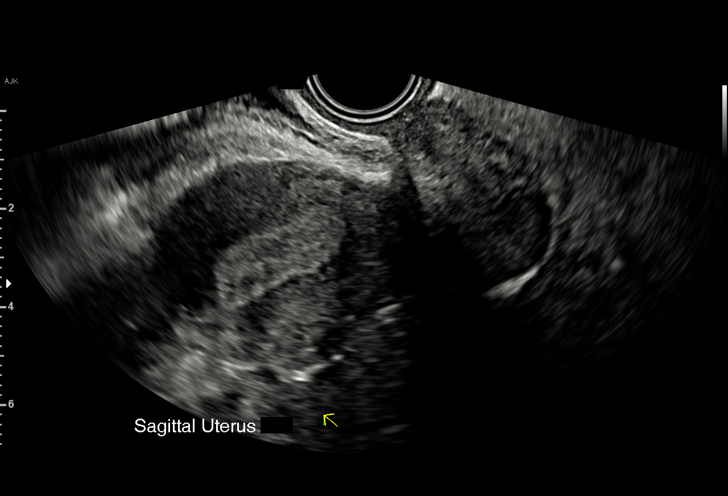
[im 37/67]
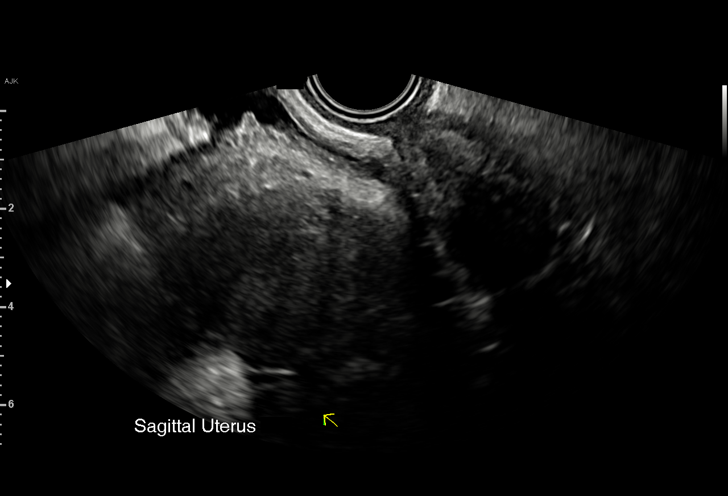
[im 42/67]
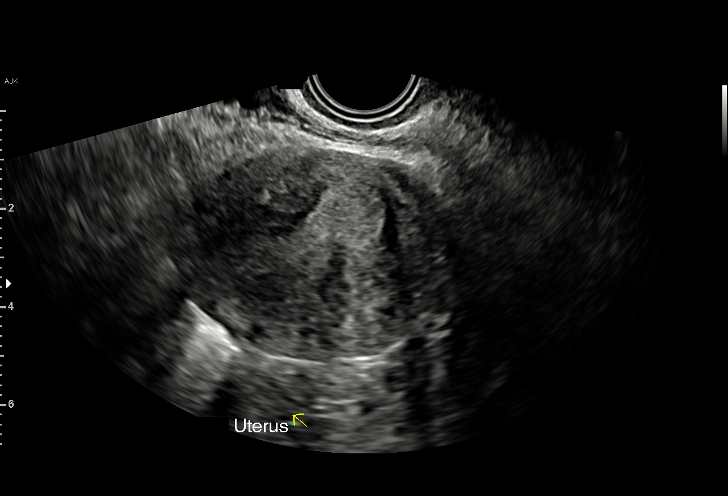
[im 47/67]
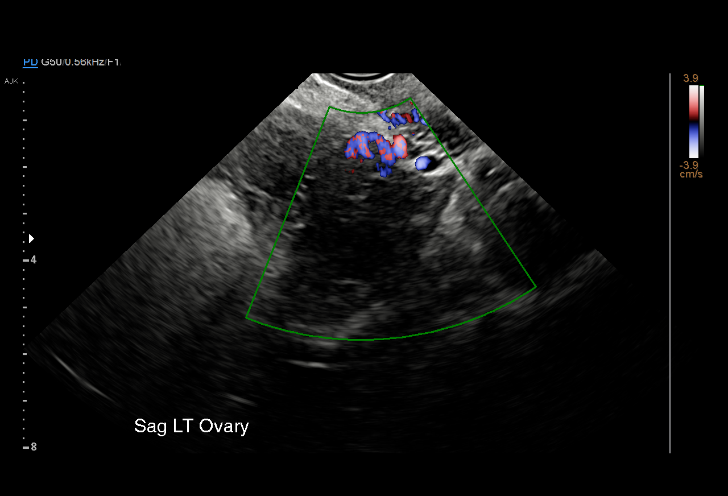
[im 52/67]
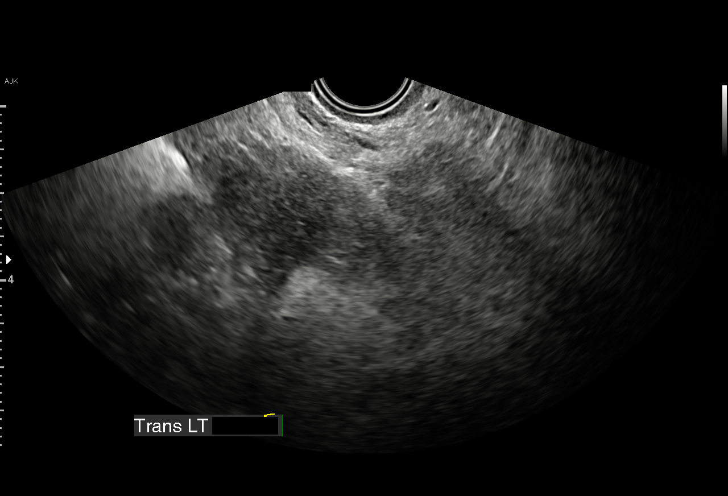
[im 57/67]
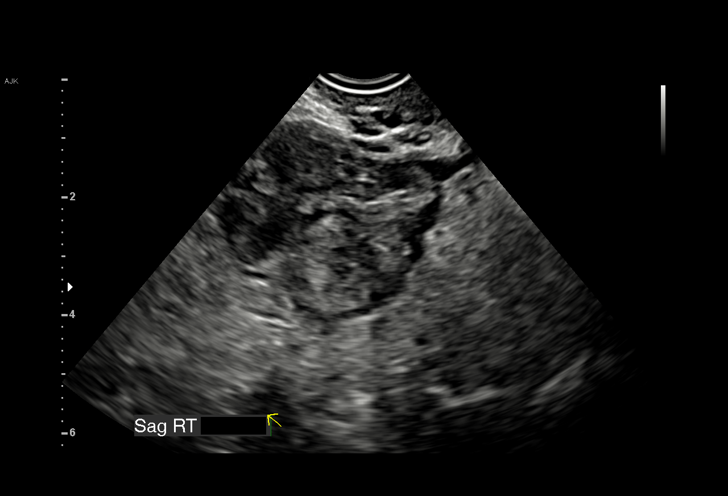
[im 62/67]
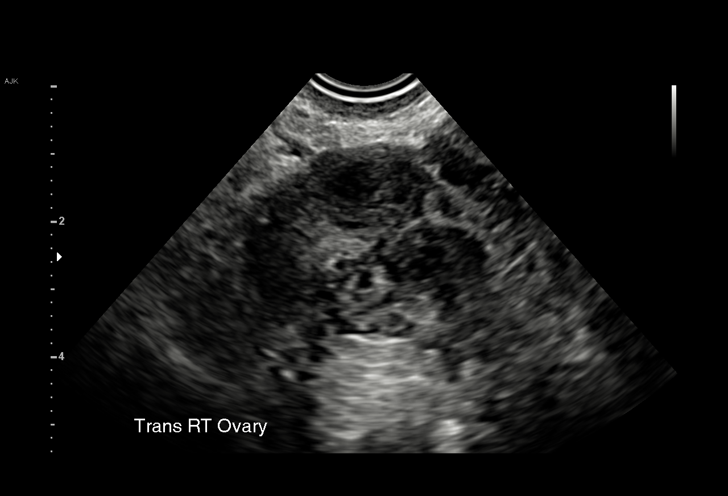
[im 67/67]
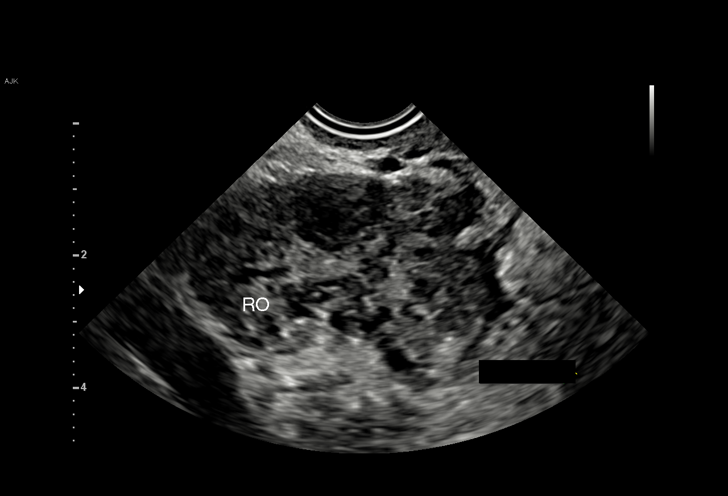

[15 of 28 positions shown; findings below may reference images not displayed]

FINDINGS: Intrauterine gestational sac: None

Maternal uterus/adnexae: No fibroids identified. The left ovary is
normal in appearance. The right ovary contains a small corpus luteum
cyst. A complex heterogeneous mass is seen in the right adnexa which
abuts but appears separate from the right ovary. This measures 3.3 x
2.1 x 2.1 cm, and is highly suspicious for ectopic pregnancy. No
evidence of hemoperitoneum.
IMPRESSION: No IUP visualized. 3.3 cm complex left adnexal mass, highly
suspicious for ectopic pregnancy.

No evidence of hemoperitoneum.

Critical Value/emergent results were called by telephone at the time
who verbally acknowledged these results.

## 2022-04-30 IMAGING — US US OB TRANSVAGINAL
1 series · 15 of 28 positions shown · non-contrast
Comparison: Three days ago

CLINICAL DATA: Known ectopic pregnancy post methotrexate, with
abdominal pain.

EXAM:
TRANSVAGINAL OB ULTRASOUND
TECHNIQUE: Transvaginal ultrasound was performed for complete evaluation of the
gestation as well as the maternal uterus, adnexal regions, and
pelvic cul-de-sac.

[Series 1: us ob transvaginal · 15 of 47 slices shown]
[im 1/47]
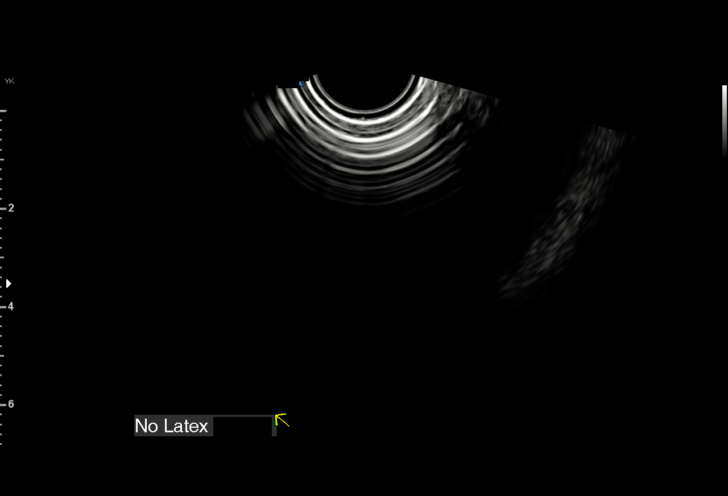
[im 4/47]
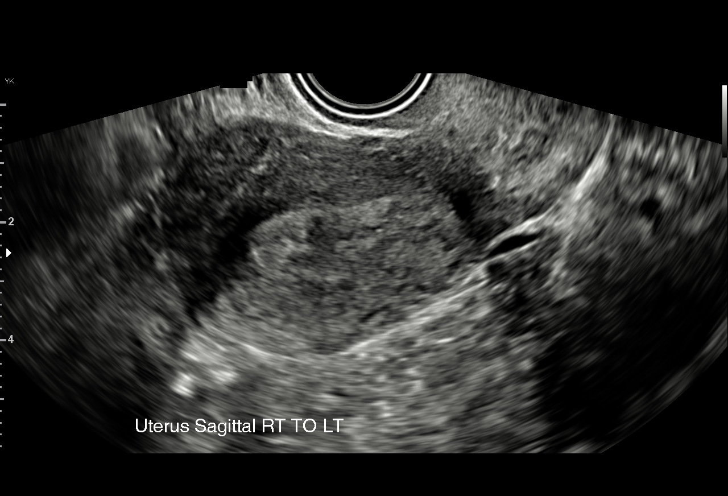
[im 7/47]
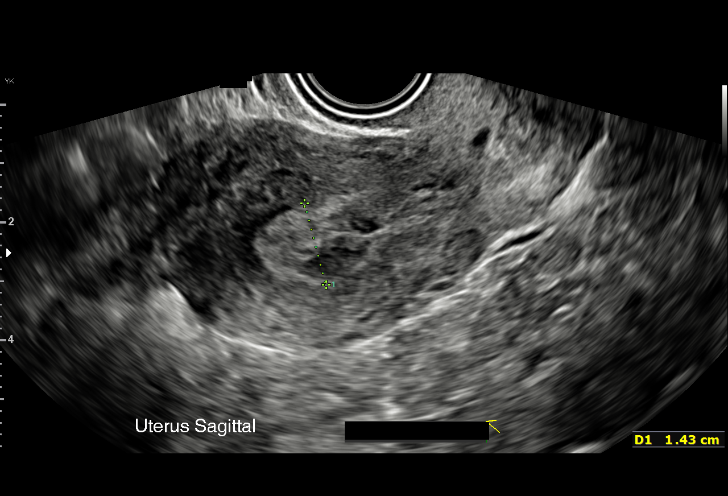
[im 11/47]
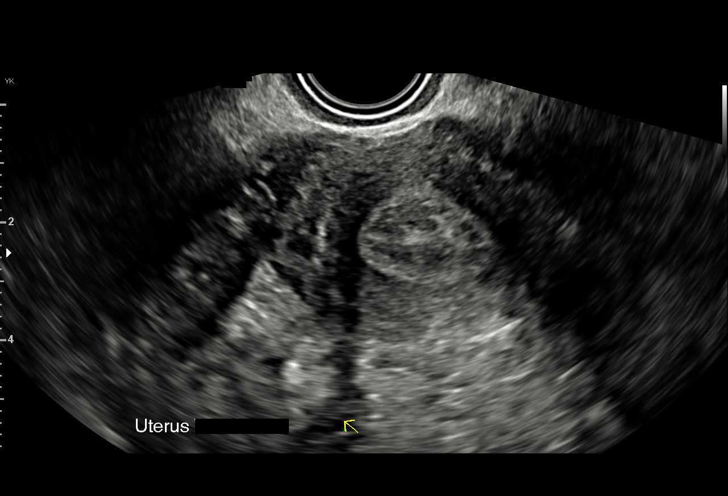
[im 14/47]
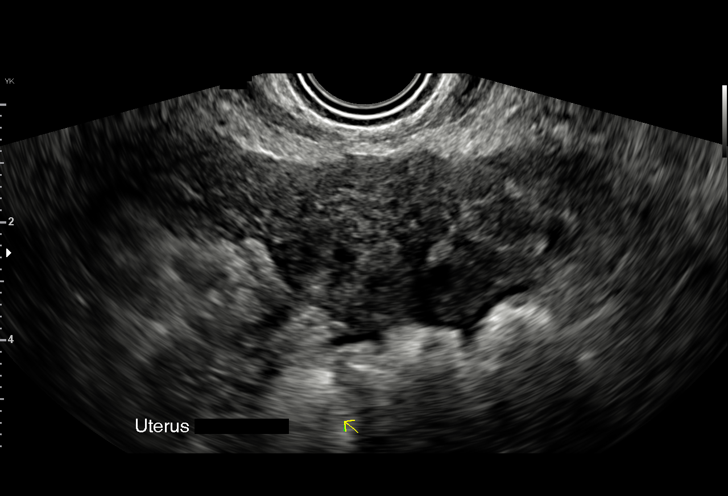
[im 18/47]
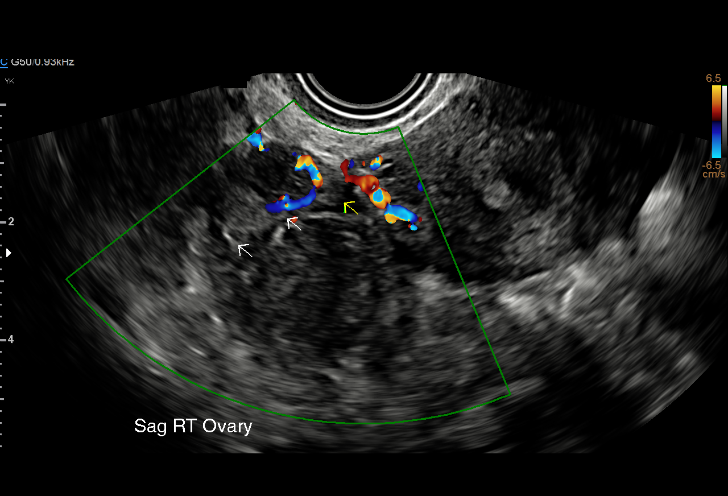
[im 21/47]
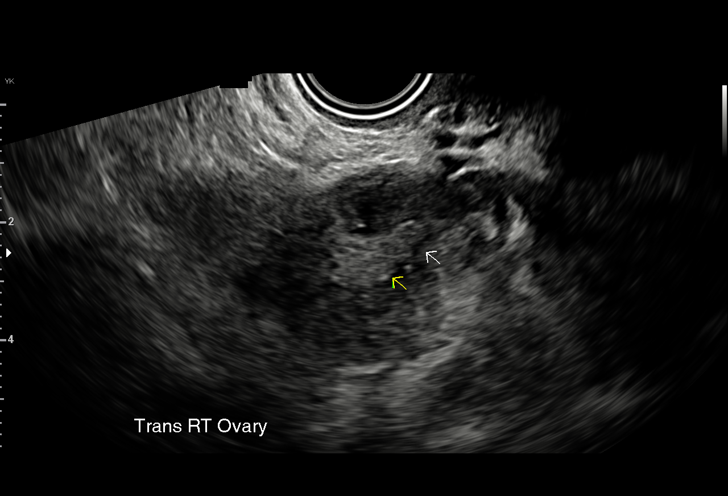
[im 24/47]
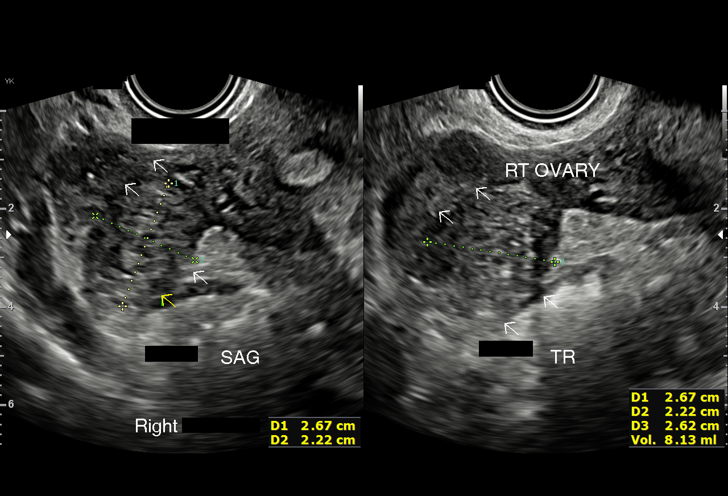
[im 26/47]
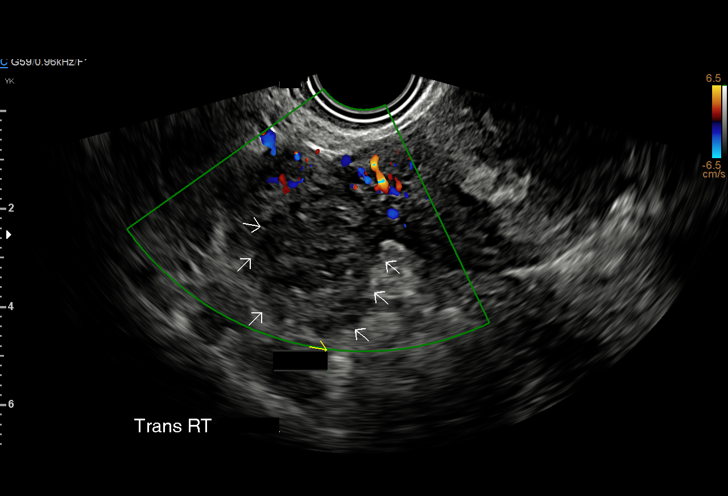
[im 29/47]
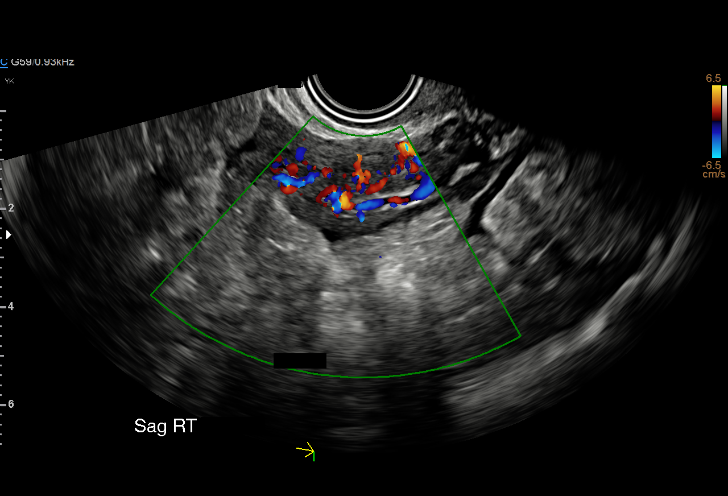
[im 33/47]
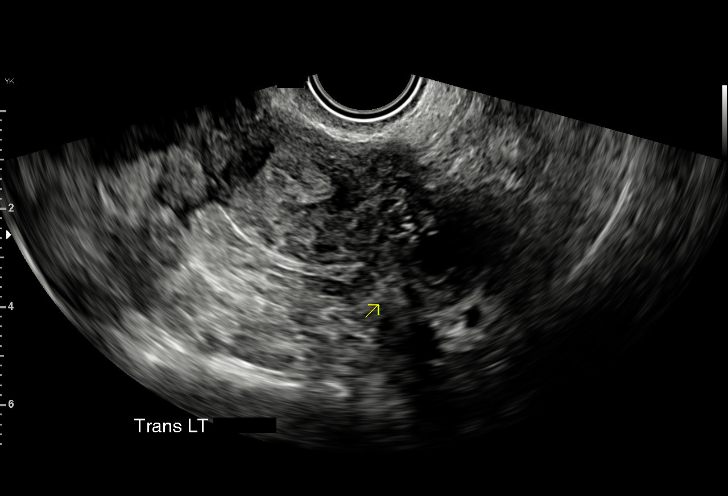
[im 36/47]
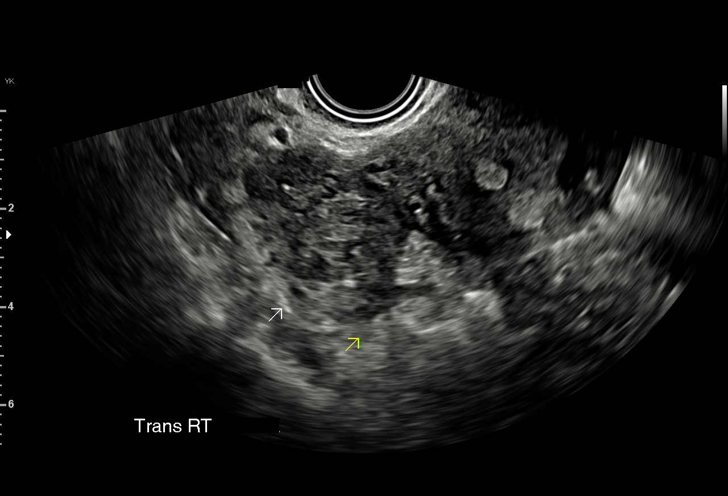
[im 40/47]
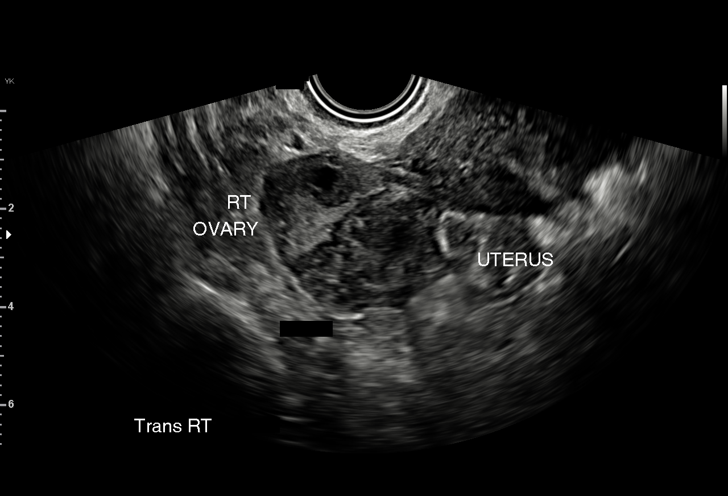
[im 43/47]
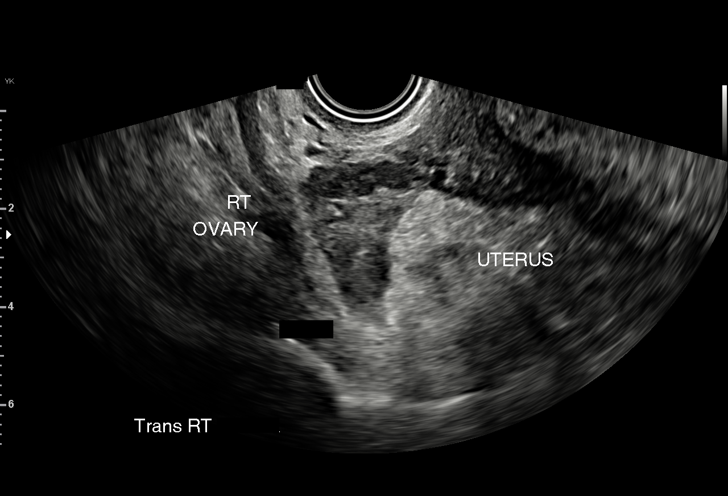
[im 47/47]
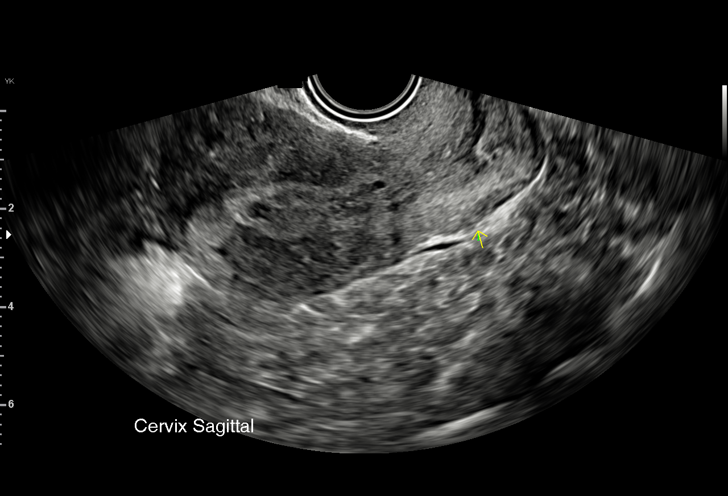

[15 of 28 positions shown; findings below may reference images not displayed]

FINDINGS: Findings remain compatible with ectopic pregnancy on the right where
there is a extra ovarian mass in the absence of a intrauterine sac.
The mass measures up to 2.6 x 2.2 x 2.7 cm, mildly improved from
before when it measured up to 3.3 cm. The degree of color Doppler
signal is stable and limited. No visible hemoperitoneum, only
borderline hypoechoic fluid in the rectovaginal recess.
IMPRESSION: Decreased size of the presumed right adnexal ectopic pregnancy (2.7
cm compared to 3.3 cm before). No visible hemoperitoneum.

## 2022-05-03 ENCOUNTER — Other Ambulatory Visit: Payer: Self-pay | Admitting: Physician Assistant

## 2022-05-03 DIAGNOSIS — J4540 Moderate persistent asthma, uncomplicated: Secondary | ICD-10-CM

## 2022-05-19 ENCOUNTER — Other Ambulatory Visit: Payer: Self-pay | Admitting: Physician Assistant

## 2022-08-03 ENCOUNTER — Other Ambulatory Visit: Payer: Self-pay | Admitting: Physician Assistant

## 2022-08-03 DIAGNOSIS — J4540 Moderate persistent asthma, uncomplicated: Secondary | ICD-10-CM

## 2022-08-23 NOTE — Progress Notes (Signed)
Lisa Colon is a 37 y.o. female here for a new problem.  History of Present Illness:   No chief complaint on file.   HPI  Shoulder and Neck Pain Treated with Mobic 7.5 mg daily.  Asthma Requesting albuterol inhaler refill.   Past Medical History:  Diagnosis Date   Asthma    Ectopic pregnancy without intrauterine pregnancy 07/2020     Social History   Tobacco Use   Smoking status: Former    Packs/day: 0.50    Years: 9.00    Total pack years: 4.50    Types: Cigarettes    Quit date: 06/13/2010    Years since quitting: 12.2   Smokeless tobacco: Never  Vaping Use   Vaping Use: Never used  Substance Use Topics   Alcohol use: Yes    Alcohol/week: 20.0 standard drinks of alcohol    Types: 20 Cans of beer per week   Drug use: No    No past surgical history on file.  Family History  Problem Relation Age of Onset   ALS Mother    COPD Mother    Hypertension Mother    Other Mother        ?RA   Bipolar disorder Sister    Depression Sister    Breast cancer Maternal Grandmother    Kidney disease Paternal Grandmother    Heart disease Paternal Grandfather    Diabetes Paternal Grandfather    Colon cancer Neg Hx     No Known Allergies  Current Medications:   Current Outpatient Medications:    ADVAIR HFA 115-21 MCG/ACT inhaler, TAKE 2 PUFFS BY MOUTH TWICE A DAY, Disp: 36 each, Rfl: 0   albuterol (VENTOLIN HFA) 108 (90 Base) MCG/ACT inhaler, Inhale 1-2 puffs into the lungs every 4 (four) hours as needed for wheezing or shortness of breath., Disp: 54 g, Rfl: 3   meloxicam (MOBIC) 7.5 MG tablet, TAKE 1 TABLET BY MOUTH EVERY DAY, Disp: 30 tablet, Rfl: 0   montelukast (SINGULAIR) 10 MG tablet, TAKE 1 TABLET BY MOUTH EVERYDAY AT BEDTIME, Disp: 90 tablet, Rfl: 1   predniSONE (DELTASONE) 20 MG tablet, Take 2 tablets (40 mg total) by mouth daily., Disp: 10 tablet, Rfl: 0   traZODone (DESYREL) 50 MG tablet, TAKE 1/2 TO 1 TABLET BY MOUTH AT BEDTIME AS NEEDED FOR SLEEP, Disp: 90  tablet, Rfl: 1   Review of Systems:   ROS  Vitals:   There were no vitals filed for this visit.   There is no height or weight on file to calculate BMI.  Physical Exam:   Physical Exam  Assessment and Plan:   ***   I,Alexander Ruley,acting as a scribe for Inda Coke, PA.,have documented all relevant documentation on the behalf of Inda Coke, PA,as directed by  Inda Coke, PA while in the presence of Inda Coke, [place].   ***   Inda Coke, PA-C

## 2022-08-24 ENCOUNTER — Encounter: Payer: Self-pay | Admitting: Physician Assistant

## 2022-08-24 ENCOUNTER — Ambulatory Visit: Payer: BC Managed Care – PPO | Admitting: Physician Assistant

## 2022-08-24 VITALS — BP 157/109 | HR 90 | Temp 97.8°F | Ht 62.0 in | Wt 254.4 lb

## 2022-08-24 DIAGNOSIS — R030 Elevated blood-pressure reading, without diagnosis of hypertension: Secondary | ICD-10-CM | POA: Diagnosis not present

## 2022-08-24 DIAGNOSIS — Z124 Encounter for screening for malignant neoplasm of cervix: Secondary | ICD-10-CM

## 2022-08-24 DIAGNOSIS — J4530 Mild persistent asthma, uncomplicated: Secondary | ICD-10-CM | POA: Diagnosis not present

## 2022-08-24 DIAGNOSIS — S46812A Strain of other muscles, fascia and tendons at shoulder and upper arm level, left arm, initial encounter: Secondary | ICD-10-CM | POA: Diagnosis not present

## 2022-08-24 MED ORDER — MONTELUKAST SODIUM 10 MG PO TABS: 10.0000 mg | ORAL_TABLET | Freq: Every day | ORAL | 3 refills | Status: DC

## 2022-08-24 MED ORDER — ALBUTEROL SULFATE HFA 108 (90 BASE) MCG/ACT IN AERS: 1.0000 | INHALATION_SPRAY | RESPIRATORY_TRACT | 3 refills | Status: AC | PRN

## 2022-08-24 MED ORDER — PREDNISONE 20 MG PO TABS: 40.0000 mg | ORAL_TABLET | Freq: Every day | ORAL | 0 refills | Status: DC

## 2022-08-24 MED ORDER — FLUTICASONE-SALMETEROL 250-50 MCG/ACT IN AEPB: 1.0000 | INHALATION_SPRAY | Freq: Two times a day (BID) | RESPIRATORY_TRACT | 3 refills | Status: AC

## 2022-09-12 NOTE — Progress Notes (Signed)
Lisa Colon is a 37 y.o. female here for a {New prob or follow up:31724}.  History of Present Illness:   No chief complaint on file.   HPI  HTN:       Past Medical History:  Diagnosis Date   Asthma    Ectopic pregnancy without intrauterine pregnancy 07/2020     Social History   Tobacco Use   Smoking status: Former    Packs/day: 0.50    Years: 9.00    Additional pack years: 0.00    Total pack years: 4.50    Types: Cigarettes    Quit date: 06/13/2010    Years since quitting: 12.2   Smokeless tobacco: Never  Vaping Use   Vaping Use: Never used  Substance Use Topics   Alcohol use: Yes    Alcohol/week: 20.0 standard drinks of alcohol    Types: 20 Cans of beer per week   Drug use: No    No past surgical history on file.  Family History  Problem Relation Age of Onset   ALS Mother    COPD Mother    Hypertension Mother    Other Mother        ?RA   Bipolar disorder Sister    Depression Sister    Breast cancer Maternal Grandmother    Kidney disease Paternal Grandmother    Heart disease Paternal Grandfather    Diabetes Paternal Grandfather    Colon cancer Neg Hx     No Known Allergies  Current Medications:   Current Outpatient Medications:    albuterol (VENTOLIN HFA) 108 (90 Base) MCG/ACT inhaler, Inhale 1-2 puffs into the lungs every 4 (four) hours as needed for wheezing or shortness of breath., Disp: 54 g, Rfl: 3   fluticasone-salmeterol (WIXELA INHUB) 250-50 MCG/ACT AEPB, Inhale 1 puff into the lungs in the morning and at bedtime., Disp: 60 each, Rfl: 3   meloxicam (MOBIC) 7.5 MG tablet, TAKE 1 TABLET BY MOUTH EVERY DAY, Disp: 30 tablet, Rfl: 0   montelukast (SINGULAIR) 10 MG tablet, Take 1 tablet (10 mg total) by mouth at bedtime., Disp: 90 tablet, Rfl: 3   predniSONE (DELTASONE) 20 MG tablet, Take 2 tablets (40 mg total) by mouth daily., Disp: 10 tablet, Rfl: 0   traZODone (DESYREL) 50 MG tablet, TAKE 1/2 TO 1 TABLET BY MOUTH AT BEDTIME AS NEEDED FOR  SLEEP, Disp: 90 tablet, Rfl: 1   Review of Systems:   ROS  Vitals:   There were no vitals filed for this visit.   There is no height or weight on file to calculate BMI.  Physical Exam:   Physical Exam  Assessment and Plan:   There are no diagnoses linked to this encounter.  I,Rachel Rivera,acting as a Education administrator for [employer], PA.,have documented all relevant documentation on the behalf of Inda Coke, PA,as directed by  Inda Coke, PA while in the presence of Inda Coke, [place].  ***  Inda Coke, PA-C

## 2022-09-13 ENCOUNTER — Encounter: Payer: Self-pay | Admitting: Physician Assistant

## 2022-09-13 ENCOUNTER — Ambulatory Visit: Payer: BC Managed Care – PPO | Admitting: Physician Assistant

## 2022-09-13 VITALS — BP 161/114 | HR 89 | Temp 97.5°F | Ht 62.0 in | Wt 250.0 lb

## 2022-09-13 DIAGNOSIS — R030 Elevated blood-pressure reading, without diagnosis of hypertension: Secondary | ICD-10-CM | POA: Diagnosis not present

## 2022-09-13 LAB — CBC WITH DIFFERENTIAL/PLATELET
Basophils Absolute: 0.1 10*3/uL (ref 0.0–0.1)
Basophils Relative: 1.1 % (ref 0.0–3.0)
Eosinophils Absolute: 0.5 10*3/uL (ref 0.0–0.7)
Eosinophils Relative: 5 % (ref 0.0–5.0)
HCT: 45.7 % (ref 36.0–46.0)
Hemoglobin: 15.5 g/dL — ABNORMAL HIGH (ref 12.0–15.0)
Lymphocytes Relative: 25.1 % (ref 12.0–46.0)
Lymphs Abs: 2.4 10*3/uL (ref 0.7–4.0)
MCHC: 33.9 g/dL (ref 30.0–36.0)
MCV: 93.9 fl (ref 78.0–100.0)
Monocytes Absolute: 0.6 10*3/uL (ref 0.1–1.0)
Monocytes Relative: 6.6 % (ref 3.0–12.0)
Neutro Abs: 6 10*3/uL (ref 1.4–7.7)
Neutrophils Relative %: 62.2 % (ref 43.0–77.0)
Platelets: 306 10*3/uL (ref 150.0–400.0)
RBC: 4.86 Mil/uL (ref 3.87–5.11)
RDW: 14.8 % (ref 11.5–15.5)
WBC: 9.7 10*3/uL (ref 4.0–10.5)

## 2022-09-13 LAB — COMPREHENSIVE METABOLIC PANEL
ALT: 31 U/L (ref 0–35)
AST: 21 U/L (ref 0–37)
Albumin: 4.3 g/dL (ref 3.5–5.2)
Alkaline Phosphatase: 67 U/L (ref 39–117)
BUN: 5 mg/dL — ABNORMAL LOW (ref 6–23)
CO2: 22 mEq/L (ref 19–32)
Calcium: 9 mg/dL (ref 8.4–10.5)
Chloride: 104 mEq/L (ref 96–112)
Creatinine, Ser: 0.78 mg/dL (ref 0.40–1.20)
GFR: 95.89 mL/min (ref >60.00)
Glucose, Bld: 93 mg/dL (ref 70–99)
Potassium: 4.1 mEq/L (ref 3.5–5.1)
Sodium: 136 mEq/L (ref 135–145)
Total Bilirubin: 0.6 mg/dL (ref 0.2–1.2)
Total Protein: 7.2 g/dL (ref 6.0–8.3)

## 2022-09-13 LAB — T4, FREE: Free T4: 0.62 ng/dL (ref 0.60–1.60)

## 2022-09-13 LAB — TSH: TSH: 2.21 u[IU]/mL (ref 0.35–5.50)

## 2022-09-13 MED ORDER — AMLODIPINE BESYLATE 5 MG PO TABS: 5.0000 mg | ORAL_TABLET | Freq: Every day | ORAL | 0 refills | Status: DC

## 2022-09-13 NOTE — Patient Instructions (Addendum)
It was great to see you!  Start amlodipine 5 mg daily for your blood pressure  Goal blood pressure is <140/90  Send me a Pharmacist, community message in 2-4 weeks with averages and we will go from there!  Take care,  Inda Coke PA-C

## 2022-09-25 ENCOUNTER — Other Ambulatory Visit: Payer: Self-pay | Admitting: Physician Assistant

## 2022-09-25 DIAGNOSIS — G4700 Insomnia, unspecified: Secondary | ICD-10-CM

## 2022-12-09 ENCOUNTER — Other Ambulatory Visit: Payer: Self-pay | Admitting: Physician Assistant

## 2023-03-11 ENCOUNTER — Other Ambulatory Visit: Payer: Self-pay | Admitting: Physician Assistant

## 2023-05-24 ENCOUNTER — Encounter: Payer: Self-pay | Admitting: Physician Assistant

## 2023-05-24 ENCOUNTER — Telehealth: Payer: 59 | Admitting: Physician Assistant

## 2023-05-24 VITALS — Ht 62.0 in | Wt 227.0 lb

## 2023-05-24 DIAGNOSIS — J4530 Mild persistent asthma, uncomplicated: Secondary | ICD-10-CM

## 2023-05-24 DIAGNOSIS — B349 Viral infection, unspecified: Secondary | ICD-10-CM

## 2023-05-24 MED ORDER — PREDNISONE 20 MG PO TABS: 40.0000 mg | ORAL_TABLET | Freq: Every day | ORAL | 0 refills | Status: DC

## 2023-05-24 MED ORDER — ONDANSETRON HCL 4 MG PO TABS: 4.0000 mg | ORAL_TABLET | Freq: Three times a day (TID) | ORAL | 0 refills | Status: AC | PRN

## 2023-05-24 NOTE — Progress Notes (Signed)
I acted as a Neurosurgeon for [employer], PA-C Corky Mull, LPN  Virtual Visit via Video Note   I, [name], PA, connected with  Lisa Colon  ([id number], 16-Jun-1985) on 05/24/23 at 11:20 AM EST by a video-enabled telemedicine application and verified that I am speaking with the correct person using two identifiers.  Location: Patient: Home Provider: [place] [facility] [place] office   I discussed the limitations of evaluation and management by telemedicine and the availability of in person appointments. The patient expressed understanding and agreed to proceed.    History of Present Illness: Lisa Colon is a 37 y.o. who identifies as a female who was assigned female at birth, and is being seen today for sinus problem. Pt c/o sinus pressure x 2 days, had vomiting x 1 day, slight deep cough started yesterday, expectorating light yellow. Denies fever, diarrhea, abdominal pain or chills. Has been taking Ibuprofen and Tylenol.  Having chest tightness and cough and feels her asthma might be worsening.  Problems:  Patient Active Problem List   Diagnosis Date Noted   Asthma 10/25/2016   Chronic left-sided low back pain 10/25/2016   Class 2 obesity due to excess calories in adult 10/25/2016    Allergies: No Known Allergies Medications:  Current Outpatient Medications:    albuterol (VENTOLIN HFA) 108 (90 Base) MCG/ACT inhaler, Inhale 1-2 puffs into the lungs every 4 (four) hours as needed for wheezing or shortness of breath., Disp: 54 g, Rfl: 3   amLODipine (NORVASC) 5 MG tablet, TAKE 1 TABLET (5 MG TOTAL) BY MOUTH DAILY., Disp: 90 tablet, Rfl: 0   fluticasone-salmeterol (WIXELA INHUB) 250-50 MCG/ACT AEPB, Inhale 1 puff into the lungs in the morning and at bedtime., Disp: 60 each, Rfl: 3   meloxicam (MOBIC) 7.5 MG tablet, TAKE 1 TABLET BY MOUTH EVERY DAY, Disp: 30 tablet, Rfl: 0   montelukast (SINGULAIR) 10 MG tablet, Take 1 tablet (10 mg total) by mouth at bedtime., Disp: 90  tablet, Rfl: 3   ondansetron (ZOFRAN) 4 MG tablet, Take 1 tablet (4 mg total) by mouth every 8 (eight) hours as needed for nausea or vomiting., Disp: 20 tablet, Rfl: 0   predniSONE (DELTASONE) 20 MG tablet, Take 2 tablets (40 mg total) by mouth daily with breakfast., Disp: 10 tablet, Rfl: 0   traZODone (DESYREL) 50 MG tablet, TAKE 1/2 TO 1 TABLET BY MOUTH AT BEDTIME AS NEEDED FOR SLEEP, Disp: 90 tablet, Rfl: 1  Observations/Objective: Patient is well-developed, well-nourished in no acute distress.  Resting comfortably  at home.  Head is normocephalic, atraumatic.  No labored breathing.  Speech is clear and coherent with logical content.  Patient is alert and oriented at baseline.   Assessment and Plan: 1. Viral infection  2. Mild persistent asthma without complication No red flags on exam.   Suspect likely viral infection - do not feel there is any indication for antibiotic(s) at this time Will initiate prednisone for possible asthma exacerbation per orders.  Discussed taking medications as prescribed.  Reviewed return precautions including new or worsening fever, SOB, new or worsening cough or other concerns.  Push fluids and rest.  I recommend that patient follow-up if symptoms worsen or persist despite treatment x 7-10 days, sooner if needed.   Follow Up Instructions: I discussed the assessment and treatment plan with the patient. The patient was provided an opportunity to ask questions and all were answered. The patient agreed with the plan and demonstrated an understanding of the instructions.  A  copy of instructions were sent to the patient via MyChart unless otherwise noted below.   The patient was advised to call back or seek an in-person evaluation if the symptoms worsen or if the condition fails to improve as anticipated.  [name], [place]

## 2023-06-14 ENCOUNTER — Other Ambulatory Visit: Payer: Self-pay | Admitting: Physician Assistant

## 2023-07-25 ENCOUNTER — Encounter: Payer: Self-pay | Admitting: Physician Assistant

## 2023-07-25 ENCOUNTER — Ambulatory Visit: Payer: 59 | Admitting: Physician Assistant

## 2023-07-25 VITALS — BP 146/100 | HR 112 | Temp 97.9°F | Ht 62.0 in | Wt 219.5 lb

## 2023-07-25 DIAGNOSIS — R112 Nausea with vomiting, unspecified: Secondary | ICD-10-CM | POA: Diagnosis not present

## 2023-07-25 DIAGNOSIS — I10 Essential (primary) hypertension: Secondary | ICD-10-CM | POA: Diagnosis not present

## 2023-07-25 LAB — URINALYSIS, ROUTINE W REFLEX MICROSCOPIC
Ketones, ur: >=80 — AB
Leukocytes,Ua: NEGATIVE
Nitrite: NEGATIVE
Specific Gravity, Urine: 1.01 (ref 1.000–1.030)
Total Protein, Urine: 30 — AB
Urine Glucose: NEGATIVE
Urobilinogen, UA: 0.2 (ref 0.0–1.0)
pH: 6 (ref 5.0–8.0)

## 2023-07-25 LAB — COMPREHENSIVE METABOLIC PANEL
ALT: 27 U/L (ref 0–35)
AST: 24 U/L (ref 0–37)
Albumin: 4.9 g/dL (ref 3.5–5.2)
Alkaline Phosphatase: 48 U/L (ref 39–117)
BUN: 5 mg/dL — ABNORMAL LOW (ref 6–23)
CO2: 20 meq/L (ref 19–32)
Calcium: 9.6 mg/dL (ref 8.4–10.5)
Chloride: 101 meq/L (ref 96–112)
Creatinine, Ser: 0.76 mg/dL (ref 0.40–1.20)
GFR: 98.33 mL/min (ref >60.00)
Glucose, Bld: 96 mg/dL (ref 70–99)
Potassium: 3.1 meq/L — ABNORMAL LOW (ref 3.5–5.1)
Sodium: 134 meq/L — ABNORMAL LOW (ref 135–145)
Total Bilirubin: 1 mg/dL (ref 0.2–1.2)
Total Protein: 8.6 g/dL — ABNORMAL HIGH (ref 6.0–8.3)

## 2023-07-25 LAB — CBC WITH DIFFERENTIAL/PLATELET
Basophils Absolute: 0.1 10*3/uL (ref 0.0–0.1)
Basophils Relative: 0.8 % (ref 0.0–3.0)
Eosinophils Absolute: 0.1 10*3/uL (ref 0.0–0.7)
Eosinophils Relative: 1.3 % (ref 0.0–5.0)
HCT: 46.8 % — ABNORMAL HIGH (ref 36.0–46.0)
Hemoglobin: 15.9 g/dL — ABNORMAL HIGH (ref 12.0–15.0)
Lymphocytes Relative: 20.8 % (ref 12.0–46.0)
Lymphs Abs: 2.4 10*3/uL (ref 0.7–4.0)
MCHC: 33.9 g/dL (ref 30.0–36.0)
MCV: 91.9 fL (ref 78.0–100.0)
Monocytes Absolute: 1 10*3/uL (ref 0.1–1.0)
Monocytes Relative: 9 % (ref 3.0–12.0)
Neutro Abs: 7.9 10*3/uL — ABNORMAL HIGH (ref 1.4–7.7)
Neutrophils Relative %: 68.1 % (ref 43.0–77.0)
Platelets: 323 10*3/uL (ref 150.0–400.0)
RBC: 5.1 Mil/uL (ref 3.87–5.11)
RDW: 14.8 % (ref 11.5–15.5)
WBC: 11.6 10*3/uL — ABNORMAL HIGH (ref 4.0–10.5)

## 2023-07-25 LAB — LIPID PANEL
Cholesterol: 173 mg/dL (ref 0–200)
HDL: 35.2 mg/dL — ABNORMAL LOW (ref >39.00)
LDL Cholesterol: 116 mg/dL — ABNORMAL HIGH (ref 0–99)
NonHDL: 138.05
Total CHOL/HDL Ratio: 5
Triglycerides: 110 mg/dL (ref 0.0–149.0)
VLDL: 22 mg/dL (ref 0.0–40.0)

## 2023-07-25 LAB — TSH: TSH: 1.5 u[IU]/mL (ref 0.35–5.50)

## 2023-07-25 LAB — GLUCOSE, POCT (MANUAL RESULT ENTRY): POC Glucose: 106 mg/dL — AB (ref 70–99)

## 2023-07-25 LAB — LIPASE: Lipase: 33 U/L (ref 11.0–59.0)

## 2023-07-25 NOTE — Patient Instructions (Signed)
It was great to see you!  We will do blood work for further evaluation. Please return stool test if symptom(s) worsen or do not improve Please go to the emergency room if symptom(s) worsen and you need IV fluids  Please follow-up in 1 month for Comprehensive Physical Exam (CPE) preventive care annual visit!   Your blood pressure is elevated in our office today.  I recommend that you monitor this at home.  Your goal blood pressure should be around < 130/80, unless you are over 53 years old, your goal may be closer to 140-150/90. Please note if you have been given other goals from a cardiologist or other healthcare provider, please defer to their recommendations.  When preparing to take your blood pressure: Plan ahead. Don't smoke, drink caffeine or exercise within 30 minutes before taking your blood pressure. Empty your bladder. Don't take the measurement over clothes. Remove the clothing over the arm that will be used to measure blood pressure. You can use either arm unless otherwise told by a healthcare provider. Usually there is not a big difference between readings on them. Be still. Allow at least five minutes of quiet rest before measurements. Don't talk or use the phone. Sit correctly. Sit with your back straight and supported (on a dining chair, rather than a sofa). Your feet should be flat on the floor. Do not cross your legs. Support your arm on a flat surface. The middle of the cuff should be placed on the upper arm at heart level.  Measure at the same time of the day. Take multiple readings and record the results. Each time you measure, take two readings one minute apart. Record the results and bring in to your next office visit.  In order to know how well the medication is working, I would like you to take your readings 1-2 hours after taking your blood pressure medication if possible. Take your blood pressure measurements and record 2-3 days per week.  If you get a high blood  pressure reading: A single high reading is not an immediate cause for alarm. If you get a reading that is higher than normal, take your blood pressure a second time. Write down the results of both measurements. Check with your health care professional to see if there's a health concern or whether there may be problems with your monitor. If your blood pressure readings are suddenly higher than 180/120 mm Hg, wait at least one minute and test again. If your readings are still very high, contact your health care professional immediately. You could be having a hypertensive crisis. Call 911 if your blood pressure is higher than 180/120 mm Hg and if you are having new signs or symptoms that may include: Chest pain Shortness of breath Back pain Numbness Weakness Change in vision Difficulty speaking Confusion Dizziness Vomiting   Take care,  [name] PA-C

## 2023-07-25 NOTE — Progress Notes (Signed)
Lisa Colon is a 38 y.o. female here for a new problem.  History of Present Illness:   Chief Complaint  Patient presents with   Nausea    Pt c/o nausea, vomited 2/6, 2/7 dry heaving everyday, stomach feels uncomfortable. Dry mouth.    HPI  Nausea  She complains today of a nausea and vomiting episode that occurred Thursday night after having some chicken tenders and fries from [employer]. Associated symptoms include abdominal discomfort, diarrhea, and daily dry heaving since then. Occasional chills that tend to occur overnight.   Patient also complains of dry mouth despite adequate water intake.  She can't seem to keep any food down including liquids.  Since then, she has had very minimal food intake, stating she was able to eat some soup without any vomiting. Denies recent increase in alcohol intake. Does report having a sold BM yesterday, first since symptoms onset. She has been taking Zofran to help with the nausea.  She does report a family history of diabetes. Blood sugar in office today is 106. Denies any alcoholic intake   HTN  She does report skipping her medication as she was afraid of not being unable to keep it down.  Blood pressure is elevated today. 160/110. She is currently prescribed amlodipine 5 mg daily. No concerns or symptoms at this time.  Possible Pre-Menopause She also reports experiencing pre-menopausal symptoms including irregular periods, night sweats, and being overly emotional.   Past Medical History:  Diagnosis Date   Asthma    Ectopic pregnancy without intrauterine pregnancy 07/2020     Social History   Tobacco Use   Smoking status: Former    Current packs/day: 0.00    Average packs/day: 0.5 packs/day for 9.0 years (4.5 ttl pk-yrs)    Types: Cigarettes    Start date: 06/13/2001    Quit date: 06/13/2010    Years since quitting: 13.1   Smokeless tobacco: Never  Vaping Use   Vaping status: Never Used  Substance Use Topics   Alcohol use: Not  Currently    Alcohol/week: 14.0 standard drinks of alcohol    Types: 14 Glasses of wine per week   Drug use: No    No past surgical history on file.  Family History  Problem Relation Age of Onset   ALS Mother    COPD Mother    Hypertension Mother    Other Mother        ?RA   Hypertension Father    Bipolar disorder Sister    Depression Sister    Breast cancer Maternal Grandmother    Hypertension Paternal Grandmother    Kidney disease Paternal Grandmother    Heart disease Paternal Grandfather    Diabetes Paternal Grandfather    Colon cancer Neg Hx     No Known Allergies  Current Medications:   Current Outpatient Medications:    albuterol (VENTOLIN HFA) 108 (90 Base) MCG/ACT inhaler, Inhale 1-2 puffs into the lungs every 4 (four) hours as needed for wheezing or shortness of breath., Disp: 54 g, Rfl: 3   amLODipine (NORVASC) 5 MG tablet, TAKE 1 TABLET (5 MG TOTAL) BY MOUTH DAILY., Disp: 30 tablet, Rfl: 2   fluticasone-salmeterol (WIXELA INHUB) 250-50 MCG/ACT AEPB, Inhale 1 puff into the lungs in the morning and at bedtime., Disp: 60 each, Rfl: 3   meloxicam (MOBIC) 7.5 MG tablet, TAKE 1 TABLET BY MOUTH EVERY DAY, Disp: 30 tablet, Rfl: 0   montelukast (SINGULAIR) 10 MG tablet, Take 1 tablet (10 mg total)  by mouth at bedtime., Disp: 90 tablet, Rfl: 3   ondansetron (ZOFRAN) 4 MG tablet, Take 1 tablet (4 mg total) by mouth every 8 (eight) hours as needed for nausea or vomiting., Disp: 20 tablet, Rfl: 0   traZODone (DESYREL) 50 MG tablet, TAKE 1/2 TO 1 TABLET BY MOUTH AT BEDTIME AS NEEDED FOR SLEEP, Disp: 90 tablet, Rfl: 1   Review of Systems:   Review of Systems  Constitutional:  Positive for chills.  Gastrointestinal:  Positive for abdominal pain, diarrhea, nausea and vomiting.  Negative unless otherwise specified per HPI.  Vitals:   Vitals:   07/25/23 1100 07/25/23 1126  BP: (!) 160/110 (!) 146/100  Pulse: (!) 112   Temp: 97.9 F (36.6 C)   TempSrc: Temporal   SpO2:  97%   Weight: 219 lb 8 oz (99.6 kg)   Height: 5\' 2"  (1.575 m)      Body mass index is 40.15 kg/m.  Physical Exam:   Physical Exam Vitals and nursing note reviewed.  Constitutional:      General: She is not in acute distress.    Appearance: She is well-developed. She is not ill-appearing or toxic-appearing.  Cardiovascular:     Rate and Rhythm: Normal rate and regular rhythm.     Pulses: Normal pulses.     Heart sounds: Normal heart sounds, S1 normal and S2 normal.  Pulmonary:     Effort: Pulmonary effort is normal.     Breath sounds: Normal breath sounds.  Abdominal:     General: Abdomen is flat. Bowel sounds are normal.     Palpations: Abdomen is soft.     Tenderness: There is no abdominal tenderness.  Skin:    General: Skin is warm and dry.  Neurological:     Mental Status: She is alert.     GCS: GCS eye subscore is 4. GCS verbal subscore is 5. GCS motor subscore is 6.  Psychiatric:        Speech: Speech normal.        Behavior: Behavior normal. Behavior is cooperative.     Assessment and Plan:   Nausea and vomiting, unspecified vomiting type No red flags on discussion or evidence of acute abdomen Suspect viral illness Encouraged brat diet Will order basic blood work and urinalysis Currently on menses, denies concerns for pregnancy Also advised as follows: Please return stool test if symptom(s) worsen or do not improve Please go to the emergency room if symptom(s) worsen and you need IV fluids  Primary hypertension Above goal today No evidence of end-organ damage on my exam Recommend patient monitor home blood pressure at least a few times weekly Continue amlodipine 5 mg daily If home monitoring shows consistent elevation, or any symptom(s) develop, recommend reach out to [place] for further advice on next steps Recommend close follow-up with [place] in 1 month or so to update Comprehensive Physical Exam (CPE) preventive care annual visit and follow-up on elevated  readings.   [name], PA-C  I,Safa M Kadhim,acting as a scribe for [name], PA.,have documented all relevant documentation on the behalf of [name], PA,as directed by  [name], PA while in the presence of [name], [place].   I, [name], [place], have reviewed all documentation for this visit. The documentation on 07/25/23 for the exam, diagnosis, procedures, and orders are all accurate and complete.

## 2023-07-26 ENCOUNTER — Encounter: Payer: Self-pay | Admitting: Physician Assistant

## 2023-09-05 NOTE — Progress Notes (Signed)
 Subjective:    Lisa Colon is a 38 y.o. female and is here for a comprehensive physical exam.  HPI  Health Maintenance Due  Topic Date Due   Pneumococcal Vaccine 70-70 Years old (1 of 2 - PCV) Never done   Hepatitis C Screening  Never done   Cervical Cancer Screening (HPV/Pap Cotest)  Never done    Acute Concerns: Patient is requesting a gyn referral today and to be updated on her mammogram   Chronic Issues: Asthma  Reports compliance and good tolerance of Wixela 250-50 mcg daily and singular 10 mg daily.  Patient states that she has not been using her albuterol as often as she's not I need of it. No concerns or symptoms at this time.  HTN  Reports compliance and good tolerance of Amlodipine 5 mg daily.  BP is 130/86. Denies any lower extremity edema or GI symptoms.  No concerns or symptoms at this time.  Health Maintenance: Immunizations -- N/A Colonoscopy -- N/A Mammogram -- N/A PAP -- last done on 08-24-22  Bone Density -- N/A Diet -- follows a typical diet  Exercise -- daily walks   Sleep habits -- no complains  Mood -- stable  UTD with dentist? - no UTD with eye doctor? - no  Weight history: Wt Readings from Last 10 Encounters:  09/06/23 214 lb 8 oz (97.3 kg)  07/25/23 219 lb 8 oz (99.6 kg)  05/24/23 227 lb (103 kg)  09/13/22 250 lb (113.4 kg)  08/24/22 254 lb 6.1 oz (115.4 kg)  06/08/21 254 lb (115.2 kg)  02/09/21 254 lb (115.2 kg)  07/27/20 252 lb (114.3 kg)  07/13/20 250 lb (113.4 kg)  12/26/19 232 lb 3.2 oz (105.3 kg)   Body mass index is 34.1 kg/m. Patient's last menstrual period was 08/08/2023 (exact date).  Alcohol use:  reports current alcohol use of about 14.0 standard drinks of alcohol per week.  Tobacco use:  Tobacco Use: Medium Risk (09/06/2023)   Patient History    Smoking Tobacco Use: Former    Smokeless Tobacco Use: Never    Passive Exposure: Not on file   Eligible for lung cancer screening? no     09/06/2023    8:03 AM   Depression screen PHQ 2/9  Decreased Interest 0  Down, Depressed, Hopeless 0  PHQ - 2 Score 0     Other providers/specialists: Patient Care Team: [name], [place] as PCP - General (Physician Assistant)    PMHx, SurgHx, SocialHx, Medications, and Allergies were reviewed in the Visit Navigator and updated as appropriate.   Past Medical History:  Diagnosis Date   Asthma    Ectopic pregnancy without intrauterine pregnancy 07/2020    No past surgical history on file.   Family History  Problem Relation Age of Onset   ALS Mother    COPD Mother    Hypertension Mother    Other Mother        ?RA   Hypertension Father    Bipolar disorder Sister    Depression Sister    Breast cancer Maternal Grandmother    Lung cancer Maternal Grandmother    Hypertension Paternal Grandmother    Kidney disease Paternal Grandmother    Heart disease Paternal Grandfather    Diabetes Paternal Grandfather    Colon cancer Neg Hx     Social History   Tobacco Use   Smoking status: Former    Current packs/day: 0.00    Average packs/day: 0.5 packs/day for 9.0 years (4.5  ttl pk-yrs)    Types: Cigarettes    Start date: 06/13/2001    Quit date: 06/13/2010    Years since quitting: 13.2   Smokeless tobacco: Never  Vaping Use   Vaping status: Never Used  Substance Use Topics   Alcohol use: Yes    Alcohol/week: 14.0 standard drinks of alcohol    Types: 14 Glasses of wine per week   Drug use: No    Review of Systems:   Review of Systems  Constitutional:  Negative for chills, fever, malaise/fatigue and weight loss.  HENT:  Negative for hearing loss, sinus pain and sore throat.   Respiratory:  Negative for cough and hemoptysis.   Cardiovascular:  Negative for chest pain, palpitations, leg swelling and PND.  Gastrointestinal: Negative.  Negative for abdominal pain, constipation, diarrhea, heartburn, nausea and vomiting.  Genitourinary:  Negative for dysuria, frequency and urgency.   Musculoskeletal:  Negative for back pain, myalgias and neck pain.  Skin:  Negative for itching and rash.  Neurological:  Negative for dizziness, seizures and headaches.  Endo/Heme/Allergies:  Negative for polydipsia.  Psychiatric/Behavioral:  Negative for depression. The patient is not nervous/anxious.     Objective:   BP 130/86 (BP Location: Left Arm, Patient Position: Sitting, Cuff Size: Large)   Pulse 83   Temp 97.9 F (36.6 C) (Temporal)   Ht 5' 6.5" (1.689 m)   Wt 214 lb 8 oz (97.3 kg)   LMP 08/08/2023 (Exact Date)   SpO2 98%   BMI 34.10 kg/m  Body mass index is 34.1 kg/m.   General Appearance:    Alert, cooperative, no distress, appears stated age  Head:    Normocephalic, without obvious abnormality, atraumatic  Eyes:    PERRL, conjunctiva/corneas clear, EOM's intact, fundi    benign, both eyes  Ears:    Normal TM's and external ear canals, both ears  Nose:   Nares normal, septum midline, mucosa normal, no drainage    or sinus tenderness  Throat:   Lips, mucosa, and tongue normal; teeth and gums normal  Neck:   Supple, symmetrical, trachea midline, no adenopathy;    thyroid:  no enlargement/tenderness/nodules; no carotid   bruit or JVD  Back:     Symmetric, no curvature, ROM normal, no CVA tenderness  Lungs:     Clear to auscultation bilaterally, respirations unlabored  Chest Wall:    No tenderness or deformity   Heart:    Regular rate and rhythm, S1 and S2 normal, no murmur, rub or gallop  Breast Exam:    Deferred  Abdomen:     Soft, non-tender, bowel sounds active all four quadrants,    no masses, no organomegaly  Genitalia:    Deferred  Extremities:   Extremities normal, atraumatic, no cyanosis or edema  Pulses:   2+ and symmetric all extremities  Skin:   Skin color, texture, turgor normal, no rashes or lesions  Lymph nodes:   Cervical, supraclavicular, and axillary nodes normal  Neurologic:   CNII-XII intact, normal strength, sensation and reflexes     throughout    Assessment/Plan:   Routine physical examination Today patient counseled on age appropriate routine health concerns for screening and prevention, each reviewed and up to date or declined. Immunizations reviewed and up to date or declined. Labs ordered and reviewed. Risk factors for depression reviewed and negative. Hearing function and visual acuity are intact. ADLs screened and addressed as needed. Functional ability and level of safety reviewed and appropriate. Education, counseling and referrals  performed based on assessed risks today. Patient provided with a copy of personalized plan for preventive services.  Obesity Continue efforts at healthy lifestyle and exercise  Primary hypertension Normotensive Continue amlodipine 5 mg daily Follow up in 1 year, sooner if concerns Continue to monitor closely at home and follow up if elevated readings  Pap smear for cervical cancer screening Recommend gynecology referral  Breast cancer screening by mammogram Referral for mammogram   [name], PA-C [place] [facility] M Kadhim,acting as a Neurosurgeon for [employer], PA.,have documented all relevant documentation on the behalf of [name], PA,as directed by  [name], PA while in the presence of [name], [place].   I, [name], [place], have reviewed all documentation for this visit. The documentation on 09/06/23 for the exam, diagnosis, procedures, and orders are all accurate and complete.

## 2023-09-06 ENCOUNTER — Encounter: Payer: Self-pay | Admitting: Physician Assistant

## 2023-09-06 ENCOUNTER — Ambulatory Visit (INDEPENDENT_AMBULATORY_CARE_PROVIDER_SITE_OTHER): Payer: 59 | Admitting: Physician Assistant

## 2023-09-06 VITALS — BP 130/86 | HR 83 | Temp 97.9°F | Ht 66.5 in | Wt 214.5 lb

## 2023-09-06 DIAGNOSIS — E669 Obesity, unspecified: Secondary | ICD-10-CM

## 2023-09-06 DIAGNOSIS — Z0000 Encounter for general adult medical examination without abnormal findings: Secondary | ICD-10-CM | POA: Diagnosis not present

## 2023-09-06 DIAGNOSIS — Z124 Encounter for screening for malignant neoplasm of cervix: Secondary | ICD-10-CM

## 2023-09-06 DIAGNOSIS — Z1231 Encounter for screening mammogram for malignant neoplasm of breast: Secondary | ICD-10-CM | POA: Diagnosis not present

## 2023-09-06 DIAGNOSIS — I10 Essential (primary) hypertension: Secondary | ICD-10-CM

## 2023-09-09 ENCOUNTER — Other Ambulatory Visit: Payer: Self-pay | Admitting: Physician Assistant

## 2023-09-30 ENCOUNTER — Other Ambulatory Visit: Payer: Self-pay | Admitting: Physician Assistant

## 2023-11-05 ENCOUNTER — Other Ambulatory Visit: Payer: Self-pay | Admitting: Physician Assistant

## 2024-01-06 ENCOUNTER — Other Ambulatory Visit: Payer: Self-pay | Admitting: Physician Assistant

## 2024-09-06 ENCOUNTER — Encounter: Admitting: Physician Assistant
